# Patient Record
Sex: Female | Born: 1940 | Marital: Married | Smoking: Current every day smoker
Health system: Southern US, Community
[De-identification: ages and names within clinical notes are randomized; demographics above are authoritative.]

## PROBLEM LIST (undated history)

## (undated) DIAGNOSIS — I5021 Acute systolic (congestive) heart failure: Secondary | ICD-10-CM

## (undated) DIAGNOSIS — Z85118 Personal history of other malignant neoplasm of bronchus and lung: Secondary | ICD-10-CM

## (undated) DIAGNOSIS — I499 Cardiac arrhythmia, unspecified: Secondary | ICD-10-CM

## (undated) DIAGNOSIS — Z8619 Personal history of other infectious and parasitic diseases: Secondary | ICD-10-CM

## (undated) DIAGNOSIS — D469 Myelodysplastic syndrome, unspecified: Secondary | ICD-10-CM

## (undated) DIAGNOSIS — I739 Peripheral vascular disease, unspecified: Secondary | ICD-10-CM

## (undated) DIAGNOSIS — R630 Anorexia: Secondary | ICD-10-CM

## (undated) DIAGNOSIS — J439 Emphysema, unspecified: Secondary | ICD-10-CM

## (undated) DIAGNOSIS — J449 Chronic obstructive pulmonary disease, unspecified: Secondary | ICD-10-CM

## (undated) HISTORY — DX: Myelodysplastic syndrome, unspecified: D46.9

## (undated) HISTORY — DX: Personal history of other infectious and parasitic diseases: Z86.19

## (undated) HISTORY — DX: Cardiac arrhythmia, unspecified: I49.9

## (undated) HISTORY — PX: ANGIOPLASTY: SHX39

## (undated) HISTORY — PX: LUNG CANCER SURGERY: SHX702

## (undated) HISTORY — DX: Personal history of other malignant neoplasm of bronchus and lung: Z85.118

## (undated) HISTORY — DX: Acute systolic (congestive) heart failure: I50.21

## (undated) HISTORY — DX: Peripheral vascular disease, unspecified: I73.9

## (undated) HISTORY — DX: Emphysema, unspecified: J43.9

## (undated) HISTORY — DX: Anorexia: R63.0

## (undated) HISTORY — DX: Chronic obstructive pulmonary disease, unspecified: J44.9

## (undated) HISTORY — PX: OTHER SURGICAL HISTORY: SHX169

---

## 2003-11-06 HISTORY — PX: LOBECTOMY: SHX5089

## 2004-01-20 ENCOUNTER — Other Ambulatory Visit: Payer: Self-pay

## 2004-09-08 ENCOUNTER — Ambulatory Visit: Payer: Self-pay | Admitting: Oncology

## 2004-09-15 ENCOUNTER — Ambulatory Visit: Payer: Self-pay | Admitting: Oncology

## 2004-10-05 ENCOUNTER — Ambulatory Visit: Payer: Self-pay | Admitting: Oncology

## 2004-12-15 ENCOUNTER — Ambulatory Visit: Payer: Self-pay | Admitting: Oncology

## 2005-01-03 ENCOUNTER — Ambulatory Visit: Payer: Self-pay | Admitting: Oncology

## 2005-02-09 ENCOUNTER — Ambulatory Visit: Payer: Self-pay | Admitting: Oncology

## 2005-06-15 ENCOUNTER — Ambulatory Visit: Payer: Self-pay | Admitting: Oncology

## 2005-07-06 ENCOUNTER — Ambulatory Visit: Payer: Self-pay | Admitting: Oncology

## 2005-10-05 ENCOUNTER — Ambulatory Visit: Payer: Self-pay | Admitting: Oncology

## 2005-10-09 ENCOUNTER — Ambulatory Visit: Payer: Self-pay | Admitting: Oncology

## 2005-12-25 ENCOUNTER — Ambulatory Visit: Payer: Self-pay | Admitting: Oncology

## 2006-01-03 ENCOUNTER — Ambulatory Visit: Payer: Self-pay | Admitting: Oncology

## 2006-02-15 ENCOUNTER — Ambulatory Visit: Payer: Self-pay | Admitting: Oncology

## 2006-03-05 ENCOUNTER — Ambulatory Visit: Payer: Self-pay | Admitting: Oncology

## 2006-04-05 ENCOUNTER — Ambulatory Visit: Payer: Self-pay | Admitting: Oncology

## 2006-05-05 ENCOUNTER — Ambulatory Visit: Payer: Self-pay | Admitting: Oncology

## 2006-06-05 ENCOUNTER — Ambulatory Visit: Payer: Self-pay | Admitting: Oncology

## 2006-07-26 ENCOUNTER — Ambulatory Visit: Payer: Self-pay | Admitting: Oncology

## 2006-08-05 ENCOUNTER — Ambulatory Visit: Payer: Self-pay | Admitting: Oncology

## 2006-09-06 ENCOUNTER — Ambulatory Visit: Payer: Self-pay | Admitting: Oncology

## 2006-10-05 ENCOUNTER — Ambulatory Visit: Payer: Self-pay | Admitting: Oncology

## 2006-11-05 ENCOUNTER — Ambulatory Visit: Payer: Self-pay | Admitting: Oncology

## 2007-03-06 ENCOUNTER — Ambulatory Visit: Payer: Self-pay | Admitting: Oncology

## 2007-03-07 ENCOUNTER — Ambulatory Visit: Payer: Self-pay | Admitting: Oncology

## 2007-04-06 ENCOUNTER — Ambulatory Visit: Payer: Self-pay | Admitting: Oncology

## 2007-05-06 ENCOUNTER — Ambulatory Visit: Payer: Self-pay | Admitting: Oncology

## 2007-06-06 ENCOUNTER — Ambulatory Visit: Payer: Self-pay | Admitting: Oncology

## 2007-07-03 ENCOUNTER — Ambulatory Visit: Payer: Self-pay | Admitting: Oncology

## 2007-07-07 ENCOUNTER — Ambulatory Visit: Payer: Self-pay | Admitting: Oncology

## 2007-07-16 ENCOUNTER — Ambulatory Visit: Payer: Self-pay | Admitting: Vascular Surgery

## 2007-08-06 ENCOUNTER — Ambulatory Visit: Payer: Self-pay | Admitting: Oncology

## 2007-09-06 ENCOUNTER — Ambulatory Visit: Payer: Self-pay | Admitting: Oncology

## 2007-09-26 ENCOUNTER — Ambulatory Visit: Payer: Self-pay | Admitting: Oncology

## 2007-10-06 ENCOUNTER — Ambulatory Visit: Payer: Self-pay | Admitting: Oncology

## 2007-11-06 ENCOUNTER — Ambulatory Visit: Payer: Self-pay | Admitting: Oncology

## 2007-11-07 ENCOUNTER — Ambulatory Visit: Payer: Self-pay | Admitting: Oncology

## 2007-11-07 ENCOUNTER — Inpatient Hospital Stay: Payer: Self-pay | Admitting: Oncology

## 2007-11-13 ENCOUNTER — Other Ambulatory Visit: Payer: Self-pay

## 2007-11-15 ENCOUNTER — Inpatient Hospital Stay: Payer: Self-pay | Admitting: Oncology

## 2007-11-15 ENCOUNTER — Other Ambulatory Visit: Payer: Self-pay

## 2007-11-17 ENCOUNTER — Other Ambulatory Visit: Payer: Self-pay

## 2007-12-07 ENCOUNTER — Ambulatory Visit: Payer: Self-pay | Admitting: Oncology

## 2008-01-04 ENCOUNTER — Ambulatory Visit: Payer: Self-pay | Admitting: Oncology

## 2008-02-04 ENCOUNTER — Ambulatory Visit: Payer: Self-pay | Admitting: Oncology

## 2008-02-06 ENCOUNTER — Ambulatory Visit: Payer: Self-pay | Admitting: Oncology

## 2008-03-05 ENCOUNTER — Ambulatory Visit: Payer: Self-pay | Admitting: Oncology

## 2008-03-08 ENCOUNTER — Ambulatory Visit: Payer: Self-pay | Admitting: Vascular Surgery

## 2008-03-19 ENCOUNTER — Ambulatory Visit: Payer: Self-pay | Admitting: Vascular Surgery

## 2008-04-05 ENCOUNTER — Ambulatory Visit: Payer: Self-pay | Admitting: Oncology

## 2008-05-05 ENCOUNTER — Ambulatory Visit: Payer: Self-pay | Admitting: Oncology

## 2008-05-15 ENCOUNTER — Inpatient Hospital Stay: Payer: Self-pay | Admitting: Oncology

## 2008-06-05 ENCOUNTER — Ambulatory Visit: Payer: Self-pay | Admitting: Oncology

## 2008-06-30 ENCOUNTER — Inpatient Hospital Stay: Payer: Self-pay | Admitting: Oncology

## 2008-07-06 ENCOUNTER — Ambulatory Visit: Payer: Self-pay | Admitting: Oncology

## 2008-08-05 ENCOUNTER — Ambulatory Visit: Payer: Self-pay | Admitting: Oncology

## 2008-09-05 ENCOUNTER — Ambulatory Visit: Payer: Self-pay | Admitting: Oncology

## 2008-10-05 ENCOUNTER — Ambulatory Visit: Payer: Self-pay | Admitting: Oncology

## 2008-11-05 ENCOUNTER — Ambulatory Visit: Payer: Self-pay | Admitting: Oncology

## 2008-11-10 ENCOUNTER — Ambulatory Visit: Payer: Self-pay | Admitting: Pain Medicine

## 2008-11-17 ENCOUNTER — Ambulatory Visit: Payer: Self-pay | Admitting: Pain Medicine

## 2008-12-06 ENCOUNTER — Ambulatory Visit: Payer: Self-pay | Admitting: Oncology

## 2008-12-15 IMAGING — CT NM PET TUM IMG LTD AREA
1 of 2 series · 13 of 25 positions shown · non-contrast
Comparison: none

REASON FOR EXAM: Abnormal chest CT, lung CA
COMMENTS:

PROCEDURE:     PET - PET/CT DX LUNG CA  - November 19, 2007 [DATE]
RESULT:     The patient has a history of lung cancer, abnormal chest CT.
TECHNIQUE: Standard PET/CT is obtained. Blood sugar of 122 mg/dl is noted
prior to the procedure. 12 mCi of F-18/FDG was administered to the patient
and whole body PET/CT obtained.

[Series 3: ct wb 3.0 b30f · axial · 3.0mm · 0.98mm/px · z∈[-1275,-529]mm · 13 of 432 slices shown]
[im 31/432  soft-tissue]
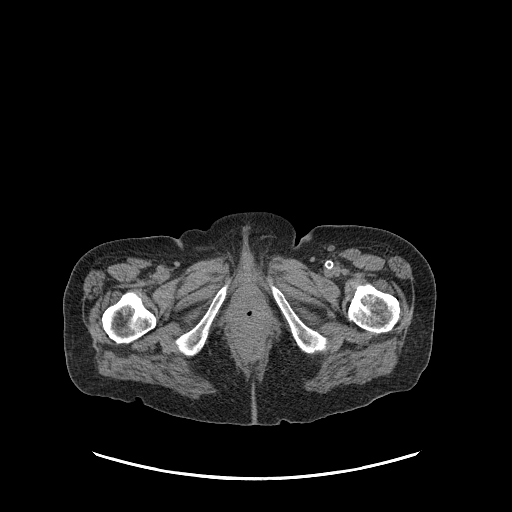
[im 62/432  soft-tissue]
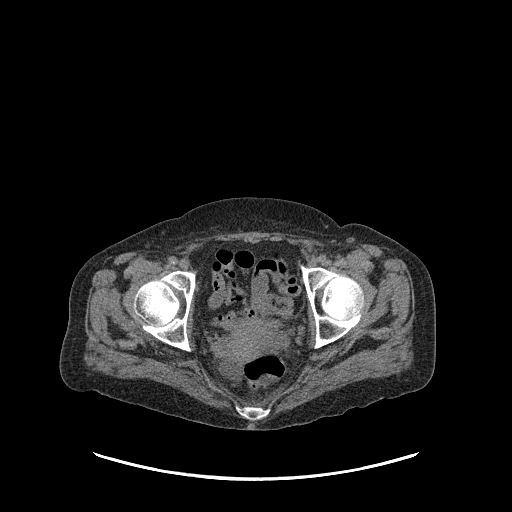
[im 93/432  soft-tissue]
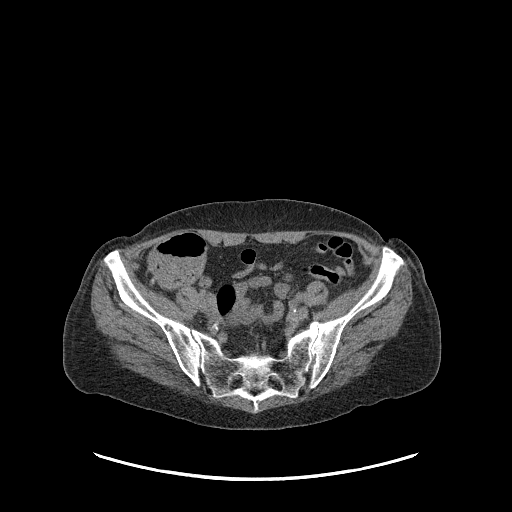
[im 124/432  soft-tissue]
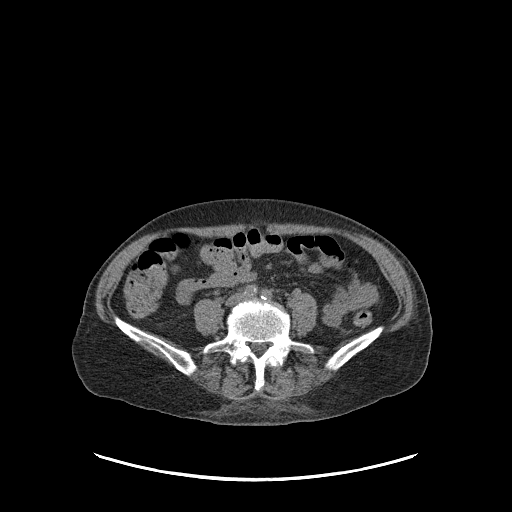
[im 154/432  soft-tissue]
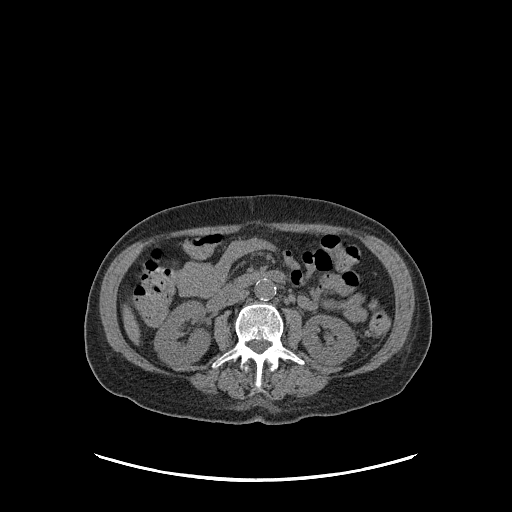
[im 185/432  soft-tissue]
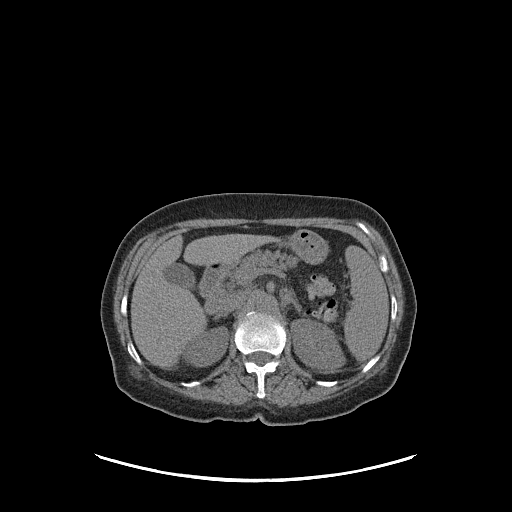
[im 216/432  soft-tissue]
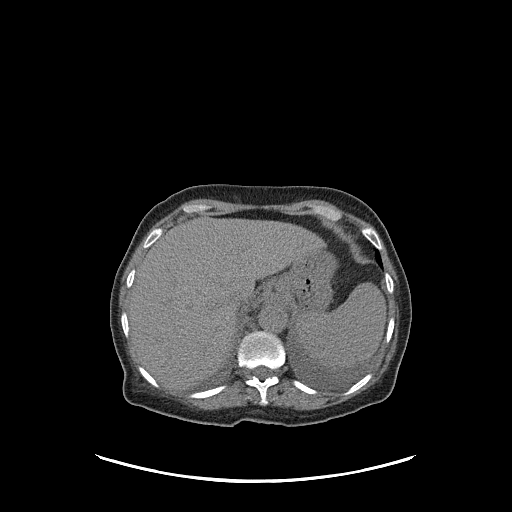
[im 247/432  soft-tissue]
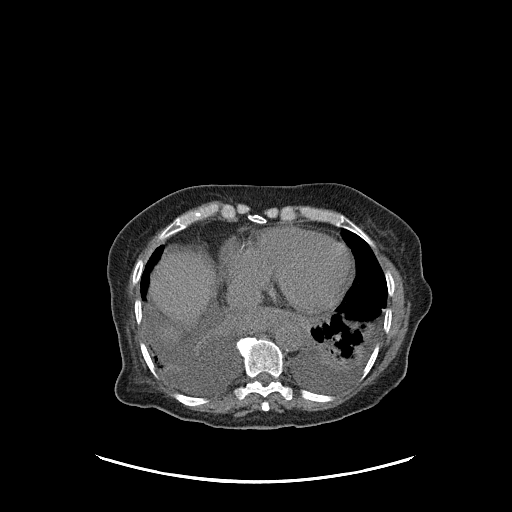
[im 278/432  soft-tissue]
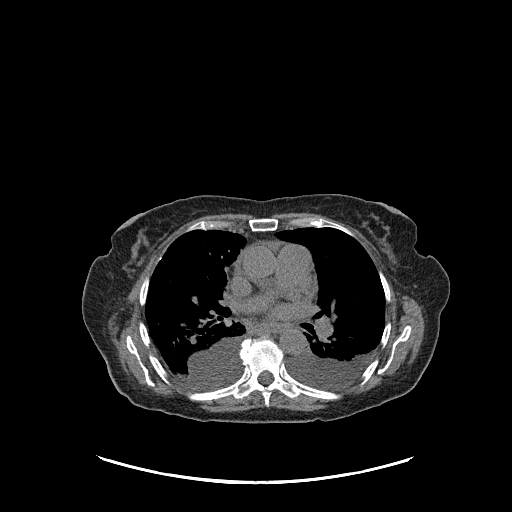
[im 308/432  soft-tissue]
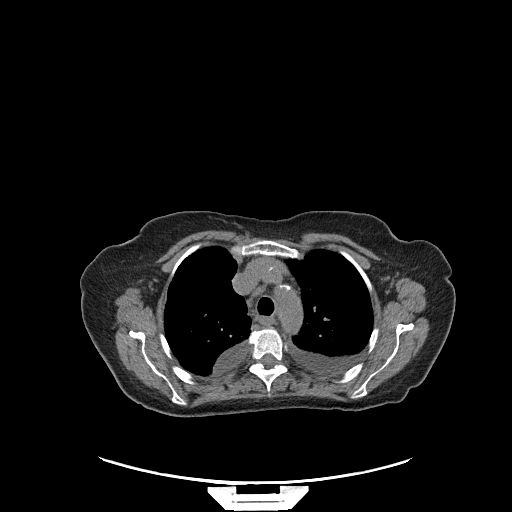
[im 339/432  soft-tissue]
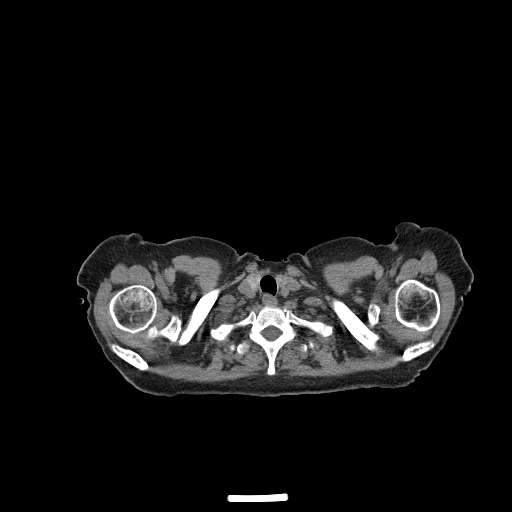
[im 370/432  soft-tissue]
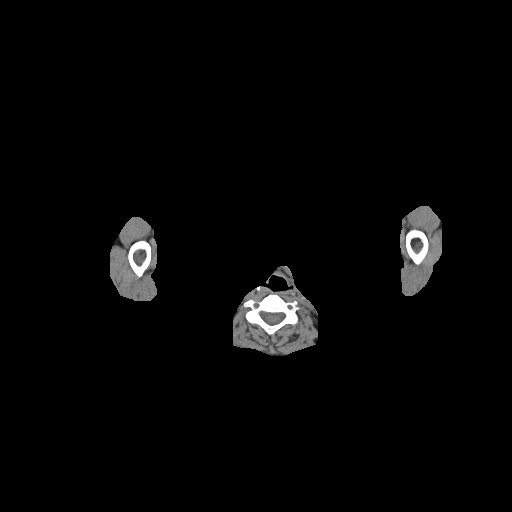
[im 401/432  soft-tissue]
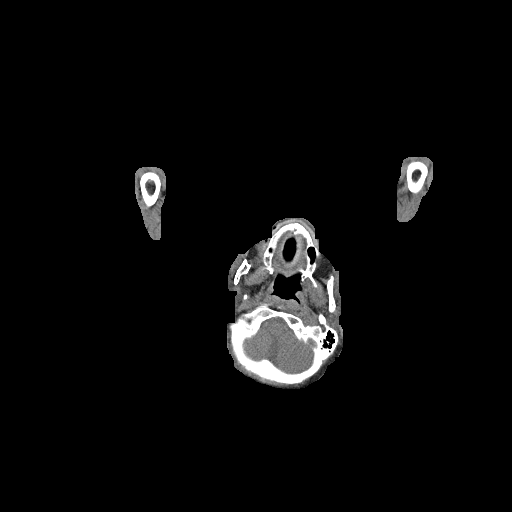

[13 of 25 positions shown; findings below may reference images not displayed]

Comparison is made to prior recent CT of chest, abdomen and pelvis of
11/09/2007.

Subcarinal PET positive adenopathy is noted. SUV values of 4 are noted in
the subcarinal nodes. Noted is a small, hiatal hernia. The stomach is very
FDG avid as is the lower esophagus. To exclude esophageal or gastric
malignancy, endoscopic evaluation is suggested. No other intra-abdominal or
pelvic abnormalities are identified. There is a borderline
retrocaval/pretracheal, tiny lymph node with SUV of 2.7.
IMPRESSION: 1.     PET positive subcarinal lymphadenopathy. Borderline PET positive
pretracheal/retrocaval node.
2.     Hiatal hernia.
3.     The distal esophagus, hiatal hernia and remainder of the
subdiaphragmatic stomach are intensely PET positive. Upper endoscopy is
suggested for further evaluation of the esophagus and stomach to exclude
malignancy.

## 2008-12-28 ENCOUNTER — Ambulatory Visit: Payer: Self-pay | Admitting: Pain Medicine

## 2008-12-29 ENCOUNTER — Ambulatory Visit: Payer: Self-pay | Admitting: Pain Medicine

## 2009-01-03 ENCOUNTER — Ambulatory Visit: Payer: Self-pay | Admitting: Oncology

## 2009-01-20 ENCOUNTER — Ambulatory Visit: Payer: Self-pay | Admitting: Pain Medicine

## 2009-01-26 ENCOUNTER — Ambulatory Visit: Payer: Self-pay | Admitting: Pain Medicine

## 2009-02-03 ENCOUNTER — Ambulatory Visit: Payer: Self-pay | Admitting: Oncology

## 2009-03-01 ENCOUNTER — Ambulatory Visit: Payer: Self-pay | Admitting: Pain Medicine

## 2009-03-05 ENCOUNTER — Ambulatory Visit: Payer: Self-pay | Admitting: Oncology

## 2009-03-07 ENCOUNTER — Ambulatory Visit: Payer: Self-pay | Admitting: Pain Medicine

## 2009-04-05 ENCOUNTER — Ambulatory Visit: Payer: Self-pay | Admitting: Oncology

## 2009-04-05 ENCOUNTER — Ambulatory Visit: Payer: Self-pay | Admitting: Pain Medicine

## 2009-05-05 ENCOUNTER — Ambulatory Visit: Payer: Self-pay | Admitting: Oncology

## 2009-06-05 ENCOUNTER — Ambulatory Visit: Payer: Self-pay | Admitting: Oncology

## 2009-07-06 ENCOUNTER — Ambulatory Visit: Payer: Self-pay | Admitting: Oncology

## 2009-09-02 ENCOUNTER — Ambulatory Visit: Payer: Self-pay | Admitting: Oncology

## 2009-09-05 ENCOUNTER — Ambulatory Visit: Payer: Self-pay | Admitting: Oncology

## 2009-09-09 ENCOUNTER — Ambulatory Visit: Payer: Self-pay | Admitting: Oncology

## 2009-10-05 ENCOUNTER — Ambulatory Visit: Payer: Self-pay | Admitting: Oncology

## 2009-11-05 ENCOUNTER — Ambulatory Visit: Payer: Self-pay | Admitting: Oncology

## 2010-01-03 ENCOUNTER — Ambulatory Visit: Payer: Self-pay | Admitting: Oncology

## 2010-01-20 ENCOUNTER — Ambulatory Visit: Payer: Self-pay | Admitting: Oncology

## 2010-02-03 ENCOUNTER — Ambulatory Visit: Payer: Self-pay | Admitting: Oncology

## 2010-04-05 ENCOUNTER — Ambulatory Visit: Payer: Self-pay | Admitting: Oncology

## 2010-04-28 ENCOUNTER — Ambulatory Visit: Payer: Self-pay | Admitting: Oncology

## 2010-05-05 ENCOUNTER — Ambulatory Visit: Payer: Self-pay | Admitting: Oncology

## 2010-07-06 ENCOUNTER — Ambulatory Visit: Payer: Self-pay | Admitting: Oncology

## 2010-07-12 ENCOUNTER — Ambulatory Visit: Payer: Self-pay | Admitting: Oncology

## 2010-08-05 ENCOUNTER — Ambulatory Visit: Payer: Self-pay | Admitting: Oncology

## 2010-09-05 ENCOUNTER — Ambulatory Visit: Payer: Self-pay | Admitting: Oncology

## 2010-11-10 ENCOUNTER — Ambulatory Visit: Payer: Self-pay | Admitting: Oncology

## 2010-12-06 ENCOUNTER — Ambulatory Visit: Payer: Self-pay | Admitting: Oncology

## 2011-03-22 ENCOUNTER — Ambulatory Visit: Payer: Self-pay | Admitting: Oncology

## 2011-04-06 ENCOUNTER — Ambulatory Visit: Payer: Self-pay | Admitting: Oncology

## 2011-07-20 ENCOUNTER — Ambulatory Visit: Payer: Self-pay | Admitting: Oncology

## 2011-08-06 ENCOUNTER — Ambulatory Visit: Payer: Self-pay | Admitting: Oncology

## 2011-10-31 ENCOUNTER — Inpatient Hospital Stay: Payer: Self-pay | Admitting: Oncology

## 2011-10-31 ENCOUNTER — Ambulatory Visit: Payer: Self-pay | Admitting: Oncology

## 2011-11-06 ENCOUNTER — Ambulatory Visit: Payer: Self-pay | Admitting: Oncology

## 2011-11-13 LAB — CBC CANCER CENTER
Basophil #: 0 x10 3/mm (ref 0.0–0.1)
Eosinophil #: 0.1 x10 3/mm (ref 0.0–0.7)
HCT: 41.6 % (ref 35.0–47.0)
Lymphocyte #: 0.1 x10 3/mm — ABNORMAL LOW (ref 1.0–3.6)
Lymphocyte %: 4.7 %
MCV: 81 fL (ref 80–100)
Monocyte %: 3.6 %
Platelet: 167 x10 3/mm (ref 150–440)
RDW: 16.7 % — ABNORMAL HIGH (ref 11.5–14.5)
WBC: 1.5 x10 3/mm — CL (ref 3.6–11.0)

## 2011-11-13 LAB — COMPREHENSIVE METABOLIC PANEL
Anion Gap: 8 (ref 7–16)
BUN: 10 mg/dL (ref 7–18)
Calcium, Total: 8.7 mg/dL (ref 8.5–10.1)
Chloride: 96 mmol/L — ABNORMAL LOW (ref 98–107)
Co2: 29 mmol/L (ref 21–32)
Creatinine: 0.67 mg/dL (ref 0.60–1.30)
EGFR (African American): >60
EGFR (Non-African Amer.): >60
Glucose: 95 mg/dL (ref 65–99)
SGOT(AST): 15 U/L (ref 15–37)
SGPT (ALT): 16 U/L
Total Protein: 6.3 g/dL — ABNORMAL LOW (ref 6.4–8.2)

## 2011-12-07 ENCOUNTER — Ambulatory Visit: Payer: Self-pay | Admitting: Oncology

## 2012-01-30 ENCOUNTER — Ambulatory Visit: Payer: Self-pay | Admitting: Oncology

## 2012-01-30 LAB — CBC CANCER CENTER
Basophil #: 0 x10 3/mm (ref 0.0–0.1)
Basophil %: 0 %
Eosinophil #: 0.1 x10 3/mm (ref 0.0–0.7)
Eosinophil %: 2.8 %
HCT: 36.4 % (ref 35.0–47.0)
HGB: 12.6 g/dL (ref 12.0–16.0)
Lymphocyte #: 0.1 x10 3/mm — ABNORMAL LOW (ref 1.0–3.6)
Lymphocyte %: 6.4 %
MCHC: 34.5 g/dL (ref 32.0–36.0)
MCV: 81 fL (ref 80–100)
Monocyte #: 0.2 x10 3/mm (ref 0.0–0.7)
Neutrophil #: 1.6 x10 3/mm (ref 1.4–6.5)
Neutrophil %: 80.3 %
RBC: 4.49 10*6/uL (ref 3.80–5.20)
RDW: 16 % — ABNORMAL HIGH (ref 11.5–14.5)
WBC: 2 x10 3/mm — CL (ref 3.6–11.0)

## 2012-02-04 ENCOUNTER — Ambulatory Visit: Payer: Self-pay | Admitting: Oncology

## 2012-02-14 LAB — COMPREHENSIVE METABOLIC PANEL
Anion Gap: 10 (ref 7–16)
BUN: 6 mg/dL — ABNORMAL LOW (ref 7–18)
Bilirubin,Total: 1.2 mg/dL — ABNORMAL HIGH (ref 0.2–1.0)
Co2: 25 mmol/L (ref 21–32)
EGFR (Non-African Amer.): >60
Glucose: 145 mg/dL — ABNORMAL HIGH (ref 65–99)
Potassium: 3.8 mmol/L (ref 3.5–5.1)
Total Protein: 6.8 g/dL (ref 6.4–8.2)

## 2012-02-14 LAB — CBC CANCER CENTER
Basophil #: 0 x10 3/mm (ref 0.0–0.1)
Eosinophil #: 0 x10 3/mm (ref 0.0–0.7)
Eosinophil %: 1.1 %
HCT: 36.8 % (ref 35.0–47.0)
HGB: 12.6 g/dL (ref 12.0–16.0)
MCH: 27.2 pg (ref 26.0–34.0)
MCV: 79 fL — ABNORMAL LOW (ref 80–100)
Monocyte #: 0.1 x10 3/mm — ABNORMAL LOW (ref 0.2–0.9)
Neutrophil %: 90.1 %
Platelet: 184 x10 3/mm (ref 150–440)
RDW: 16.1 % — ABNORMAL HIGH (ref 11.5–14.5)
WBC: 3 x10 3/mm — ABNORMAL LOW (ref 3.6–11.0)

## 2012-02-20 LAB — CULTURE, BLOOD (SINGLE)

## 2012-02-21 LAB — COMPREHENSIVE METABOLIC PANEL
Albumin: 3.1 g/dL — ABNORMAL LOW (ref 3.4–5.0)
Alkaline Phosphatase: 84 U/L (ref 50–136)
Anion Gap: 10 (ref 7–16)
BUN: 8 mg/dL (ref 7–18)
Creatinine: 0.76 mg/dL (ref 0.60–1.30)
Osmolality: 257 (ref 275–301)
SGOT(AST): 16 U/L (ref 15–37)
SGPT (ALT): 11 U/L — ABNORMAL LOW

## 2012-02-21 LAB — CBC WITH DIFFERENTIAL/PLATELET
Basophil %: 0.8 %
HCT: 34.7 % — ABNORMAL LOW (ref 35.0–47.0)
HGB: 11.5 g/dL — ABNORMAL LOW (ref 12.0–16.0)
MCH: 26 pg (ref 26.0–34.0)
MCV: 79 fL — ABNORMAL LOW (ref 80–100)
Monocyte #: 0.2 x10 3/mm (ref 0.2–0.9)
Monocyte %: 6.3 %
Neutrophil %: 88.1 %
Platelet: 186 10*3/uL (ref 150–440)
RDW: 15.6 % — ABNORMAL HIGH (ref 11.5–14.5)
WBC: 3 10*3/uL — ABNORMAL LOW (ref 3.6–11.0)

## 2012-03-05 ENCOUNTER — Ambulatory Visit: Payer: Self-pay | Admitting: Oncology

## 2012-03-06 ENCOUNTER — Ambulatory Visit: Payer: Self-pay | Admitting: Oncology

## 2012-04-05 ENCOUNTER — Ambulatory Visit: Payer: Self-pay | Admitting: Oncology

## 2012-04-14 LAB — CBC CANCER CENTER
Basophil #: 0 x10 3/mm (ref 0.0–0.1)
Basophil %: 1.5 %
Eosinophil #: 0.1 x10 3/mm (ref 0.0–0.7)
HCT: 39.1 % (ref 35.0–47.0)
HGB: 12.6 g/dL (ref 12.0–16.0)
MCHC: 32.3 g/dL (ref 32.0–36.0)
MCV: 79 fL — ABNORMAL LOW (ref 80–100)
Monocyte #: 0.1 x10 3/mm — ABNORMAL LOW (ref 0.2–0.9)
Neutrophil #: 1.4 x10 3/mm (ref 1.4–6.5)
Neutrophil %: 77.3 %
Platelet: 120 x10 3/mm — ABNORMAL LOW (ref 150–440)
RDW: 18.4 % — ABNORMAL HIGH (ref 11.5–14.5)

## 2012-04-14 LAB — COMPREHENSIVE METABOLIC PANEL
Albumin: 3.9 g/dL (ref 3.4–5.0)
Bilirubin,Total: 1.2 mg/dL — ABNORMAL HIGH (ref 0.2–1.0)
Calcium, Total: 9 mg/dL (ref 8.5–10.1)
Co2: 28 mmol/L (ref 21–32)
EGFR (African American): >60
EGFR (Non-African Amer.): >60
Glucose: 99 mg/dL (ref 65–99)
Osmolality: 263 (ref 275–301)
Potassium: 4.2 mmol/L (ref 3.5–5.1)
SGOT(AST): 19 U/L (ref 15–37)
Total Protein: 6.3 g/dL — ABNORMAL LOW (ref 6.4–8.2)

## 2012-05-05 ENCOUNTER — Ambulatory Visit: Payer: Self-pay | Admitting: Oncology

## 2012-06-09 ENCOUNTER — Ambulatory Visit: Payer: Self-pay | Admitting: Oncology

## 2012-06-09 LAB — CBC CANCER CENTER
Basophil %: 1 %
Eosinophil #: 0.1 x10 3/mm (ref 0.0–0.7)
HGB: 13.6 g/dL (ref 12.0–16.0)
Lymphocyte %: 6.5 %
MCH: 26.7 pg (ref 26.0–34.0)
MCHC: 33.7 g/dL (ref 32.0–36.0)
Monocyte #: 0.1 x10 3/mm — ABNORMAL LOW (ref 0.2–0.9)
Neutrophil %: 80.6 %
Platelet: 111 x10 3/mm — ABNORMAL LOW (ref 150–440)
RBC: 5.08 10*6/uL (ref 3.80–5.20)

## 2012-06-09 LAB — BASIC METABOLIC PANEL
Anion Gap: 7 (ref 7–16)
Calcium, Total: 9.1 mg/dL (ref 8.5–10.1)
Co2: 30 mmol/L (ref 21–32)
EGFR (African American): >60
EGFR (Non-African Amer.): >60
Glucose: 106 mg/dL — ABNORMAL HIGH (ref 65–99)
Osmolality: 268 (ref 275–301)
Sodium: 134 mmol/L — ABNORMAL LOW (ref 136–145)

## 2012-07-06 ENCOUNTER — Ambulatory Visit: Payer: Self-pay | Admitting: Oncology

## 2012-07-14 LAB — COMPREHENSIVE METABOLIC PANEL
Anion Gap: 6 — ABNORMAL LOW (ref 7–16)
Bilirubin,Total: 1.6 mg/dL — ABNORMAL HIGH (ref 0.2–1.0)
Chloride: 94 mmol/L — ABNORMAL LOW (ref 98–107)
Co2: 30 mmol/L (ref 21–32)
Creatinine: 0.83 mg/dL (ref 0.60–1.30)
EGFR (African American): >60
EGFR (Non-African Amer.): >60
Potassium: 4.2 mmol/L (ref 3.5–5.1)
SGOT(AST): 17 U/L (ref 15–37)
SGPT (ALT): 24 U/L (ref 12–78)

## 2012-07-14 LAB — CBC CANCER CENTER
Basophil #: 0 x10 3/mm (ref 0.0–0.1)
Eosinophil #: 0.1 x10 3/mm (ref 0.0–0.7)
Lymphocyte %: 2.1 %
MCHC: 32.7 g/dL (ref 32.0–36.0)
Neutrophil #: 3.9 x10 3/mm (ref 1.4–6.5)
Neutrophil %: 88.1 %
RDW: 17.4 % — ABNORMAL HIGH (ref 11.5–14.5)

## 2012-07-22 ENCOUNTER — Other Ambulatory Visit: Payer: Self-pay | Admitting: Internal Medicine

## 2012-08-05 ENCOUNTER — Ambulatory Visit: Payer: Self-pay | Admitting: Oncology

## 2012-08-06 ENCOUNTER — Ambulatory Visit: Payer: Self-pay | Admitting: Internal Medicine

## 2012-08-07 LAB — CBC CANCER CENTER
Basophil %: 0.5 %
Eosinophil %: 1.4 %
HCT: 39.2 % (ref 35.0–47.0)
Lymphocyte #: 0.1 x10 3/mm — ABNORMAL LOW (ref 1.0–3.6)
MCV: 82 fL (ref 80–100)
Monocyte %: 5.4 %
Neutrophil #: 5.2 x10 3/mm (ref 1.4–6.5)
RBC: 4.78 10*6/uL (ref 3.80–5.20)
RDW: 18.3 % — ABNORMAL HIGH (ref 11.5–14.5)
WBC: 5.7 x10 3/mm (ref 3.6–11.0)

## 2012-08-07 LAB — COMPREHENSIVE METABOLIC PANEL
Alkaline Phosphatase: 71 U/L (ref 50–136)
Anion Gap: 10 (ref 7–16)
Bilirubin,Total: 1.2 mg/dL — ABNORMAL HIGH (ref 0.2–1.0)
Calcium, Total: 8.5 mg/dL (ref 8.5–10.1)
Co2: 28 mmol/L (ref 21–32)
Creatinine: 0.81 mg/dL (ref 0.60–1.30)
Osmolality: 272 (ref 275–301)
SGPT (ALT): 21 U/L (ref 12–78)

## 2012-08-12 LAB — CULTURE, BLOOD (SINGLE)

## 2012-08-27 LAB — CBC CANCER CENTER
Basophil %: 0.2 %
Eosinophil #: 0.1 x10 3/mm (ref 0.0–0.7)
Eosinophil %: 1 %
HCT: 39.6 % (ref 35.0–47.0)
HGB: 13 g/dL (ref 12.0–16.0)
Lymphocyte %: 4.7 %
MCH: 26.5 pg (ref 26.0–34.0)
MCHC: 32.7 g/dL (ref 32.0–36.0)
MCV: 81 fL (ref 80–100)
Monocyte %: 4.4 %
Neutrophil #: 5.5 x10 3/mm (ref 1.4–6.5)
Neutrophil %: 89.7 %
RDW: 17.7 % — ABNORMAL HIGH (ref 11.5–14.5)
WBC: 6.2 x10 3/mm (ref 3.6–11.0)

## 2012-08-27 LAB — COMPREHENSIVE METABOLIC PANEL
Alkaline Phosphatase: 70 U/L (ref 50–136)
Anion Gap: 9 (ref 7–16)
Calcium, Total: 9 mg/dL (ref 8.5–10.1)
Chloride: 95 mmol/L — ABNORMAL LOW (ref 98–107)
Co2: 29 mmol/L (ref 21–32)
Creatinine: 0.62 mg/dL (ref 0.60–1.30)
EGFR (African American): >60
EGFR (Non-African Amer.): >60
SGPT (ALT): 18 U/L (ref 12–78)
Sodium: 133 mmol/L — ABNORMAL LOW (ref 136–145)

## 2012-09-01 LAB — COMPREHENSIVE METABOLIC PANEL
Albumin: 2.8 g/dL — ABNORMAL LOW (ref 3.4–5.0)
Alkaline Phosphatase: 57 U/L (ref 50–136)
Anion Gap: 6 — ABNORMAL LOW (ref 7–16)
BUN: 16 mg/dL (ref 7–18)
Calcium, Total: 8.4 mg/dL — ABNORMAL LOW (ref 8.5–10.1)
Chloride: 93 mmol/L — ABNORMAL LOW (ref 98–107)
Co2: 30 mmol/L (ref 21–32)
Glucose: 107 mg/dL — ABNORMAL HIGH (ref 65–99)
Osmolality: 261 (ref 275–301)
Potassium: 4.2 mmol/L (ref 3.5–5.1)
SGOT(AST): 13 U/L — ABNORMAL LOW (ref 15–37)
SGPT (ALT): 21 U/L (ref 12–78)

## 2012-09-01 LAB — CBC CANCER CENTER
Basophil %: 0.2 %
Eosinophil #: 0.1 x10 3/mm (ref 0.0–0.7)
Eosinophil %: 1.1 %
HGB: 12.4 g/dL (ref 12.0–16.0)
Lymphocyte %: 2.9 %
Monocyte %: 3.6 %
Neutrophil #: 6.9 x10 3/mm — ABNORMAL HIGH (ref 1.4–6.5)
Neutrophil %: 92.2 %
Platelet: 155 x10 3/mm (ref 150–440)
RBC: 4.61 10*6/uL (ref 3.80–5.20)

## 2012-09-03 ENCOUNTER — Inpatient Hospital Stay: Payer: Self-pay | Admitting: Oncology

## 2012-09-03 LAB — COMPREHENSIVE METABOLIC PANEL
Albumin: 2.7 g/dL — ABNORMAL LOW (ref 3.4–5.0)
Alkaline Phosphatase: 62 U/L (ref 50–136)
Anion Gap: 11 (ref 7–16)
BUN: 17 mg/dL (ref 7–18)
Calcium, Total: 8.3 mg/dL — ABNORMAL LOW (ref 8.5–10.1)
Co2: 26 mmol/L (ref 21–32)
Creatinine: 0.72 mg/dL (ref 0.60–1.30)
EGFR (Non-African Amer.): >60
Glucose: 99 mg/dL (ref 65–99)
Osmolality: 261 (ref 275–301)
SGPT (ALT): 26 U/L (ref 12–78)
Sodium: 129 mmol/L — ABNORMAL LOW (ref 136–145)
Total Protein: 5.5 g/dL — ABNORMAL LOW (ref 6.4–8.2)

## 2012-09-03 LAB — CBC CANCER CENTER
Basophil %: 0.1 %
Eosinophil #: 0.1 x10 3/mm (ref 0.0–0.7)
HGB: 11.3 g/dL — ABNORMAL LOW (ref 12.0–16.0)
MCH: 26.8 pg (ref 26.0–34.0)
MCHC: 32.6 g/dL (ref 32.0–36.0)
Monocyte #: 0.4 x10 3/mm (ref 0.2–0.9)
Neutrophil #: 5 x10 3/mm (ref 1.4–6.5)
Neutrophil %: 88.2 %
Platelet: 159 x10 3/mm (ref 150–440)
RDW: 17.8 % — ABNORMAL HIGH (ref 11.5–14.5)
WBC: 5.6 x10 3/mm (ref 3.6–11.0)

## 2012-09-03 LAB — MAGNESIUM: Magnesium: 1.5 mg/dL — ABNORMAL LOW

## 2012-09-05 ENCOUNTER — Ambulatory Visit: Payer: Self-pay | Admitting: Oncology

## 2012-09-07 LAB — EXPECTORATED SPUTUM ASSESSMENT W GRAM STAIN, RFLX TO RESP C

## 2012-09-09 LAB — CULTURE, BLOOD (SINGLE)

## 2012-09-10 LAB — COMPREHENSIVE METABOLIC PANEL
Albumin: 2.5 g/dL — ABNORMAL LOW (ref 3.4–5.0)
Alkaline Phosphatase: 59 U/L (ref 50–136)
Anion Gap: 5 — ABNORMAL LOW (ref 7–16)
BUN: 13 mg/dL (ref 7–18)
Bilirubin,Total: 0.7 mg/dL (ref 0.2–1.0)
Chloride: 87 mmol/L — ABNORMAL LOW (ref 98–107)
Creatinine: 0.55 mg/dL — ABNORMAL LOW (ref 0.60–1.30)
Osmolality: 254 (ref 275–301)
Potassium: 3.9 mmol/L (ref 3.5–5.1)
SGPT (ALT): 24 U/L (ref 12–78)
Sodium: 126 mmol/L — ABNORMAL LOW (ref 136–145)
Total Protein: 5.3 g/dL — ABNORMAL LOW (ref 6.4–8.2)

## 2012-09-10 LAB — CBC CANCER CENTER
Basophil %: 0.6 %
Eosinophil #: 0 x10 3/mm (ref 0.0–0.7)
Eosinophil %: 0.4 %
HGB: 11.2 g/dL — ABNORMAL LOW (ref 12.0–16.0)
Lymphocyte #: 0.1 x10 3/mm — ABNORMAL LOW (ref 1.0–3.6)
Lymphocyte %: 1.9 %
Monocyte #: 0.3 x10 3/mm (ref 0.2–0.9)
Neutrophil #: 4.7 x10 3/mm (ref 1.4–6.5)
Neutrophil %: 91.5 %
Platelet: 197 x10 3/mm (ref 150–440)
RBC: 4.11 10*6/uL (ref 3.80–5.20)
WBC: 5.1 x10 3/mm (ref 3.6–11.0)

## 2012-09-17 LAB — CBC CANCER CENTER
Basophil %: 0.5 %
Eosinophil #: 0 x10 3/mm (ref 0.0–0.7)
Eosinophil %: 0.2 %
HCT: 35.6 % (ref 35.0–47.0)
HGB: 11.7 g/dL — ABNORMAL LOW (ref 12.0–16.0)
Lymphocyte %: 3.7 %
MCH: 27.6 pg (ref 26.0–34.0)
Monocyte #: 0.5 x10 3/mm (ref 0.2–0.9)
Neutrophil #: 7.1 x10 3/mm — ABNORMAL HIGH (ref 1.4–6.5)
Neutrophil %: 89.2 %
RBC: 4.25 10*6/uL (ref 3.80–5.20)
WBC: 8 x10 3/mm (ref 3.6–11.0)

## 2012-09-20 ENCOUNTER — Inpatient Hospital Stay: Payer: Self-pay | Admitting: Internal Medicine

## 2012-09-20 DIAGNOSIS — I4891 Unspecified atrial fibrillation: Secondary | ICD-10-CM

## 2012-09-20 LAB — COMPREHENSIVE METABOLIC PANEL
BUN: 11 mg/dL (ref 7–18)
Bilirubin,Total: 1.6 mg/dL — ABNORMAL HIGH (ref 0.2–1.0)
Chloride: 82 mmol/L — ABNORMAL LOW (ref 98–107)
Creatinine: 0.63 mg/dL (ref 0.60–1.30)
EGFR (African American): >60
Potassium: 4.1 mmol/L (ref 3.5–5.1)
SGPT (ALT): 22 U/L (ref 12–78)
Sodium: 123 mmol/L — ABNORMAL LOW (ref 136–145)
Total Protein: 5.9 g/dL — ABNORMAL LOW (ref 6.4–8.2)

## 2012-09-20 LAB — TSH: Thyroid Stimulating Horm: 1.61 u[IU]/mL

## 2012-09-20 LAB — TROPONIN I: Troponin-I: 0.02 ng/mL

## 2012-09-20 LAB — CBC
HGB: 12.2 g/dL (ref 12.0–16.0)
MCH: 27.8 pg (ref 26.0–34.0)
MCV: 83 fL (ref 80–100)
Platelet: 168 10*3/uL (ref 150–440)
RBC: 4.4 10*6/uL (ref 3.80–5.20)
RDW: 19.7 % — ABNORMAL HIGH (ref 11.5–14.5)
WBC: 8.7 10*3/uL (ref 3.6–11.0)

## 2012-09-20 LAB — CK TOTAL AND CKMB (NOT AT ARMC): CK, Total: 26 U/L (ref 21–215)

## 2012-09-20 LAB — PROTIME-INR: Prothrombin Time: 13.6 secs (ref 11.5–14.7)

## 2012-09-21 DIAGNOSIS — I517 Cardiomegaly: Secondary | ICD-10-CM

## 2012-09-21 LAB — BASIC METABOLIC PANEL
Anion Gap: 2 — ABNORMAL LOW (ref 7–16)
Calcium, Total: 8.2 mg/dL — ABNORMAL LOW (ref 8.5–10.1)
Chloride: 94 mmol/L — ABNORMAL LOW (ref 98–107)
Creatinine: 0.51 mg/dL — ABNORMAL LOW (ref 0.60–1.30)
EGFR (African American): >60
Glucose: 186 mg/dL — ABNORMAL HIGH (ref 65–99)
Osmolality: 267 (ref 275–301)
Potassium: 3.8 mmol/L (ref 3.5–5.1)
Sodium: 131 mmol/L — ABNORMAL LOW (ref 136–145)

## 2012-09-21 LAB — CBC WITH DIFFERENTIAL/PLATELET
Basophil #: 0 10*3/uL (ref 0.0–0.1)
Eosinophil #: 0 10*3/uL (ref 0.0–0.7)
HCT: 29.7 % — ABNORMAL LOW (ref 35.0–47.0)
Lymphocyte #: 0.1 10*3/uL — ABNORMAL LOW (ref 1.0–3.6)
Lymphocyte %: 4.7 %
MCHC: 32.9 g/dL (ref 32.0–36.0)
Neutrophil #: 2.7 10*3/uL (ref 1.4–6.5)
Platelet: 125 10*3/uL — ABNORMAL LOW (ref 150–440)
RDW: 19.2 % — ABNORMAL HIGH (ref 11.5–14.5)

## 2012-09-22 DIAGNOSIS — I4891 Unspecified atrial fibrillation: Secondary | ICD-10-CM

## 2012-09-23 LAB — CBC WITH DIFFERENTIAL/PLATELET
Basophil #: 0.1 10*3/uL (ref 0.0–0.1)
Eosinophil #: 0 10*3/uL (ref 0.0–0.7)
HGB: 13.1 g/dL (ref 12.0–16.0)
Lymphocyte #: 0.2 10*3/uL — ABNORMAL LOW (ref 1.0–3.6)
MCH: 28.6 pg (ref 26.0–34.0)
MCHC: 33.8 g/dL (ref 32.0–36.0)
MCV: 85 fL (ref 80–100)
Monocyte #: 0.4 x10 3/mm (ref 0.2–0.9)
Neutrophil %: 92.4 %
Platelet: 176 10*3/uL (ref 150–440)
RDW: 20.4 % — ABNORMAL HIGH (ref 11.5–14.5)
WBC: 9 10*3/uL (ref 3.6–11.0)

## 2012-09-23 LAB — BASIC METABOLIC PANEL
Anion Gap: 6 — ABNORMAL LOW (ref 7–16)
Chloride: 84 mmol/L — ABNORMAL LOW (ref 98–107)
Co2: 40 mmol/L (ref 21–32)
EGFR (Non-African Amer.): >60
Osmolality: 266 (ref 275–301)

## 2012-09-24 LAB — BASIC METABOLIC PANEL
BUN: 15 mg/dL (ref 7–18)
Calcium, Total: 9.2 mg/dL (ref 8.5–10.1)
Chloride: 85 mmol/L — ABNORMAL LOW (ref 98–107)
EGFR (African American): >60
EGFR (Non-African Amer.): >60
Glucose: 194 mg/dL — ABNORMAL HIGH (ref 65–99)
Osmolality: 267 (ref 275–301)
Sodium: 130 mmol/L — ABNORMAL LOW (ref 136–145)

## 2012-09-25 LAB — EXPECTORATED SPUTUM ASSESSMENT W GRAM STAIN, RFLX TO RESP C

## 2012-09-26 LAB — CULTURE, BLOOD (SINGLE)

## 2012-10-05 ENCOUNTER — Ambulatory Visit: Payer: Self-pay | Admitting: Oncology

## 2012-10-06 ENCOUNTER — Ambulatory Visit: Payer: Self-pay | Admitting: Cardiovascular Disease

## 2012-10-07 ENCOUNTER — Encounter: Payer: Self-pay | Admitting: Cardiovascular Disease

## 2012-10-07 ENCOUNTER — Ambulatory Visit (INDEPENDENT_AMBULATORY_CARE_PROVIDER_SITE_OTHER): Payer: Medicare Other | Admitting: Cardiovascular Disease

## 2012-10-07 VITALS — BP 130/52 | HR 68 | Ht 67.5 in | Wt 125.0 lb

## 2012-10-07 DIAGNOSIS — J449 Chronic obstructive pulmonary disease, unspecified: Secondary | ICD-10-CM

## 2012-10-07 DIAGNOSIS — C3490 Malignant neoplasm of unspecified part of unspecified bronchus or lung: Secondary | ICD-10-CM | POA: Insufficient documentation

## 2012-10-07 DIAGNOSIS — R000 Tachycardia, unspecified: Secondary | ICD-10-CM

## 2012-10-07 DIAGNOSIS — I4891 Unspecified atrial fibrillation: Secondary | ICD-10-CM

## 2012-10-07 MED ORDER — FUROSEMIDE 20 MG PO TABS: 20.0000 mg | ORAL_TABLET | Freq: Every day | ORAL | Status: AC

## 2012-10-07 MED ORDER — POTASSIUM CHLORIDE CRYS ER 10 MEQ PO TBCR: 10.0000 meq | EXTENDED_RELEASE_TABLET | Freq: Every day | ORAL | Status: AC

## 2012-10-07 MED ORDER — METOPROLOL TARTRATE 25 MG PO TABS: 25.0000 mg | ORAL_TABLET | Freq: Two times a day (BID) | ORAL | Status: AC

## 2012-10-07 MED ORDER — AMIODARONE HCL 200 MG PO TABS: 200.0000 mg | ORAL_TABLET | Freq: Two times a day (BID) | ORAL | Status: AC

## 2012-10-07 NOTE — Patient Instructions (Addendum)
You are doing well. Please decrease the amiodarone to 200 mg twice a day (cut the 400 mg pill in 1/2, take twice a day)  Please call us if you have new issues that need to be addressed before your next appt.  Your physician wants you to follow-up in: 1 months.

## 2012-10-07 NOTE — Assessment & Plan Note (Signed)
Followed by Dr. Cliffton Asters, [facility] oncology.  Finishing her radiation this week

## 2012-10-07 NOTE — Assessment & Plan Note (Addendum)
Severe underlying COPD. She continues to smoke. Currently on prednisone and inhalers. Chronic cough on today's visit. She reports mild low grade fever. He has suggestive symptoms get worse, that she call Dr. Cliffton Asters for antibiotics.

## 2012-10-07 NOTE — Progress Notes (Signed)
Patient ID: Cheryl Duncan, female    DOB: Oct 23, 1941, 71 y.o.   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 She presents to establish care in our office. She was seen in consultation by our group.  She was started on amiodarone for rate and rhythm control with cardioversion to normal sinus rhythm. She was changed to oral amiodarone 400 mg twice a day and presents today. She reports that her shortness of breath has significantly improved. Rare episodes of tachycardia that are short lived, for the most part her heart rate has been 60-70. She has a chronic cough, nonproductive. She denies any significant change in her weight. Continued smoking. Appetite has been better on Megace and prednisone  Echocardiogram in the hospital showed ejection fraction 45-50%, mild to moderately reduced RV function, right atrium moderately dilated, mild to moderate TR, right ventricular systolic pressure 50-60 mm mercury consistent with moderate pulmonary hypertension  EKG in the hospital showed atrial flutter with ventricular rate 137 beats per minute EKG today shows normal sinus rhythm with rate 68 beats per minute, no significant ST or T wave changes   Outpatient Encounter Prescriptions as of 10/07/2012  Medication Sig Dispense Refill  . ALPRAZolam (XANAX) 0.25 MG tablet Take 0.25 mg by mouth at bedtime as needed.      Marland Kitchen amiodarone (PACERONE) 200 MG tablet Take 1 tablet (200 mg total) by mouth 2 (two) times daily.  60 tablet  6  . benzonatate  (TESSALON) 100 MG capsule Take 100 mg by mouth 3 (three) times daily as needed.      . fentaNYL (DURAGESIC - DOSED MCG/HR) 12 MCG/HR Place 1 patch onto the skin every 3 (three) days.      . furosemide (LASIX) 20 MG tablet Take 1 tablet (20 mg total) by mouth daily.  90 tablet  3  . gabapentin (NEURONTIN) 300 MG capsule Take 600 mg by mouth 3 (three) times daily.      Marland Kitchen guaifenesin (ROBITUSSIN) 100 MG/5ML syrup Take 200 mg by mouth 4 (four) times daily as needed.      Marland Kitchen ipratropium-albuterol (DUONEB) 0.5-2.5 (3) MG/3ML SOLN Take 3 mLs by nebulization every 6 (six) hours as needed.      . megestrol (MEGACE ES) 625 MG/5ML suspension Take 625 mg by mouth daily.      . metoprolol tartrate (LOPRESSOR) 25 MG tablet Take 1 tablet (25 mg total) by mouth 2 (two) times daily.  180 tablet  3  . NON FORMULARY Oxygen 2 liters      . potassium chloride (K-DUR,KLOR-CON) 10 MEQ tablet Take 1 tablet (10 mEq total) by mouth daily.  90 tablet  3  . predniSONE (DELTASONE) 20 MG tablet Take 20 mg by mouth daily.      . ranitidine (ZANTAC) 75 MG tablet Take 75 mg by mouth as needed.         Review of Systems  Constitutional: Negative.   HENT: Negative.   Eyes: Negative.   Respiratory: Positive for cough and shortness of breath.   Cardiovascular: Negative.   Gastrointestinal: Negative.   Musculoskeletal:  Positive for gait problem.  Skin: Negative.   Neurological: Negative.   Hematological: Negative.   Psychiatric/Behavioral: Negative.   All other systems reviewed and are negative.    BP 130/52  Pulse 68  Ht 5' 7.5" (1.715 m)  Wt 125 lb (56.7 kg)  BMI 19.29 kg/m2  Physical Exam  Nursing note and vitals reviewed. Constitutional: She is oriented to person, place, and time. She appears well-developed and well-nourished.  HENT:  Head: Normocephalic.  Nose: Nose normal.  Mouth/Throat: Oropharynx is clear and moist.  Eyes: Conjunctivae normal are normal. Pupils are equal, round, and reactive to light.   Neck: Normal range of motion. Neck supple. No JVD present.  Cardiovascular: Normal rate, regular rhythm, S1 normal, S2 normal, normal heart sounds and intact distal pulses.  Exam reveals no gallop and no friction rub.   No murmur heard. Pulmonary/Chest: Effort normal. No respiratory distress. She has decreased breath sounds. She has wheezes. She has rales. She exhibits no tenderness.  Abdominal: Soft. Bowel sounds are normal. She exhibits no distension. There is no tenderness.  Musculoskeletal: Normal range of motion. She exhibits no edema and no tenderness.  Lymphadenopathy:    She has no cervical adenopathy.  Neurological: She is alert and oriented to person, place, and time. Coordination normal.  Skin: Skin is warm and dry. No rash noted. No erythema.  Psychiatric: She has a normal mood and affect. Her behavior is normal. Judgment and thought content normal.         Assessment and Plan

## 2012-10-07 NOTE — Assessment & Plan Note (Addendum)
In the hospital, atrial fibrillation and atrial flutter. Conversion to normal sinus rhythm on amiodarone. Unable to tolerate other medications as blood pressure was low in the setting of pneumonia. We'll decrease amiodarone to 200 mg twice a day. Followup in several weeks' time at which time we hope to decrease her amiodarone to 200 mg daily. Continue her metoprolol  25 mg twice a day.

## 2012-11-04 ENCOUNTER — Inpatient Hospital Stay: Payer: Self-pay | Admitting: Internal Medicine

## 2012-11-04 ENCOUNTER — Telehealth: Payer: Self-pay

## 2012-11-04 LAB — CBC
HGB: 11.2 g/dL — ABNORMAL LOW (ref 12.0–16.0)
MCH: 29.1 pg (ref 26.0–34.0)
MCHC: 33.5 g/dL (ref 32.0–36.0)
MCV: 87 fL (ref 80–100)
Platelet: 176 10*3/uL (ref 150–440)
RBC: 3.86 10*6/uL (ref 3.80–5.20)
RDW: 20.6 % — ABNORMAL HIGH (ref 11.5–14.5)

## 2012-11-04 LAB — BASIC METABOLIC PANEL
BUN: 9 mg/dL (ref 7–18)
Calcium, Total: 8.9 mg/dL (ref 8.5–10.1)
Creatinine: 0.7 mg/dL (ref 0.60–1.30)
EGFR (African American): >60
EGFR (Non-African Amer.): >60
Glucose: 90 mg/dL (ref 65–99)
Osmolality: 266 (ref 275–301)
Potassium: 3.8 mmol/L (ref 3.5–5.1)
Sodium: 134 mmol/L — ABNORMAL LOW (ref 136–145)

## 2012-11-04 LAB — CK-MB: CK-MB: 1.4 ng/mL (ref 0.5–3.6)

## 2012-11-04 LAB — TROPONIN I: Troponin-I: <0.02 ng/mL

## 2012-11-04 LAB — CK TOTAL AND CKMB (NOT AT ARMC)
CK, Total: 30 U/L (ref 21–215)
CK-MB: 1.7 ng/mL (ref 0.5–3.6)

## 2012-11-04 NOTE — Telephone Encounter (Signed)
Pt's husband called to say pt has been having worsening chest pressure over the last 2 days.  Says it is worse this am and pt was unable to speak with me herself d/t "worsening shortness of breath".  She has no know hx CAD. No hx stents or cath. Has hx copd, lung ca and atrial fib Continues to smoke Wears home O2 Because pt is having active chest pressure and worsening sob, I advised husband to have pt go to ER He states, "I don't want her to go sit in the ER" I had him hold while I paged Dr. Kirke Corin Dr. Kirke Corin is also suggesting pt go to ER if she is having active symptoms I explained this to husband who says he will take her to ER ER nursing staff was made aware

## 2012-11-05 ENCOUNTER — Ambulatory Visit: Payer: Self-pay | Admitting: Oncology

## 2012-11-07 LAB — CBC CANCER CENTER
Basophil #: 0 x10 3/mm (ref 0.0–0.1)
Basophil %: 0.4 %
Eosinophil #: 0.1 x10 3/mm (ref 0.0–0.7)
Eosinophil %: 1 %
HCT: 36.6 % (ref 35.0–47.0)
HGB: 12.1 g/dL (ref 12.0–16.0)
Lymphocyte #: 0.5 x10 3/mm — ABNORMAL LOW (ref 1.0–3.6)
Lymphocyte %: 6.6 %
MCH: 29 pg (ref 26.0–34.0)
MCHC: 33.1 g/dL (ref 32.0–36.0)
MCV: 88 fL (ref 80–100)
Monocyte #: 0.7 x10 3/mm (ref 0.2–0.9)
Monocyte %: 9.3 %
Neutrophil #: 6 x10 3/mm (ref 1.4–6.5)
Neutrophil %: 82.7 %
Platelet: 230 x10 3/mm (ref 150–440)
RBC: 4.18 10*6/uL (ref 3.80–5.20)
RDW: 21.3 % — ABNORMAL HIGH (ref 11.5–14.5)
WBC: 7.2 x10 3/mm (ref 3.6–11.0)

## 2012-11-07 LAB — COMPREHENSIVE METABOLIC PANEL
Albumin: 3 g/dL — ABNORMAL LOW (ref 3.4–5.0)
Alkaline Phosphatase: 69 U/L (ref 50–136)
BUN: 17 mg/dL (ref 7–18)
Calcium, Total: 9.1 mg/dL (ref 8.5–10.1)
Chloride: 92 mmol/L — ABNORMAL LOW (ref 98–107)
Co2: 39 mmol/L — ABNORMAL HIGH (ref 21–32)
EGFR (African American): >60
Glucose: 95 mg/dL (ref 65–99)
Osmolality: 273 (ref 275–301)
SGPT (ALT): 20 U/L (ref 12–78)
Sodium: 136 mmol/L (ref 136–145)

## 2012-11-10 ENCOUNTER — Ambulatory Visit: Payer: Medicare Other | Admitting: Cardiovascular Disease

## 2012-12-06 ENCOUNTER — Ambulatory Visit: Payer: Self-pay | Admitting: Oncology

## 2013-01-14 ENCOUNTER — Ambulatory Visit: Payer: Self-pay | Admitting: Oncology

## 2013-01-16 ENCOUNTER — Ambulatory Visit: Payer: Self-pay | Admitting: Oncology

## 2013-02-03 ENCOUNTER — Ambulatory Visit: Payer: Self-pay | Admitting: Oncology

## 2013-03-05 DEATH — deceased

## 2013-09-30 IMAGING — CR DG CHEST 1V
1 series · 1 of 1 positions shown · non-contrast
Comparison: none

REASON FOR EXAM: ca lung shortness of breath
COMMENTS:

PROCEDURE:     DXR - DXR CHEST 1 VIEWAP OR PA  - September 03, 2012 [DATE]
RESULT:     Comparison is made to a prior study dated 06/17/2012.

[ap]
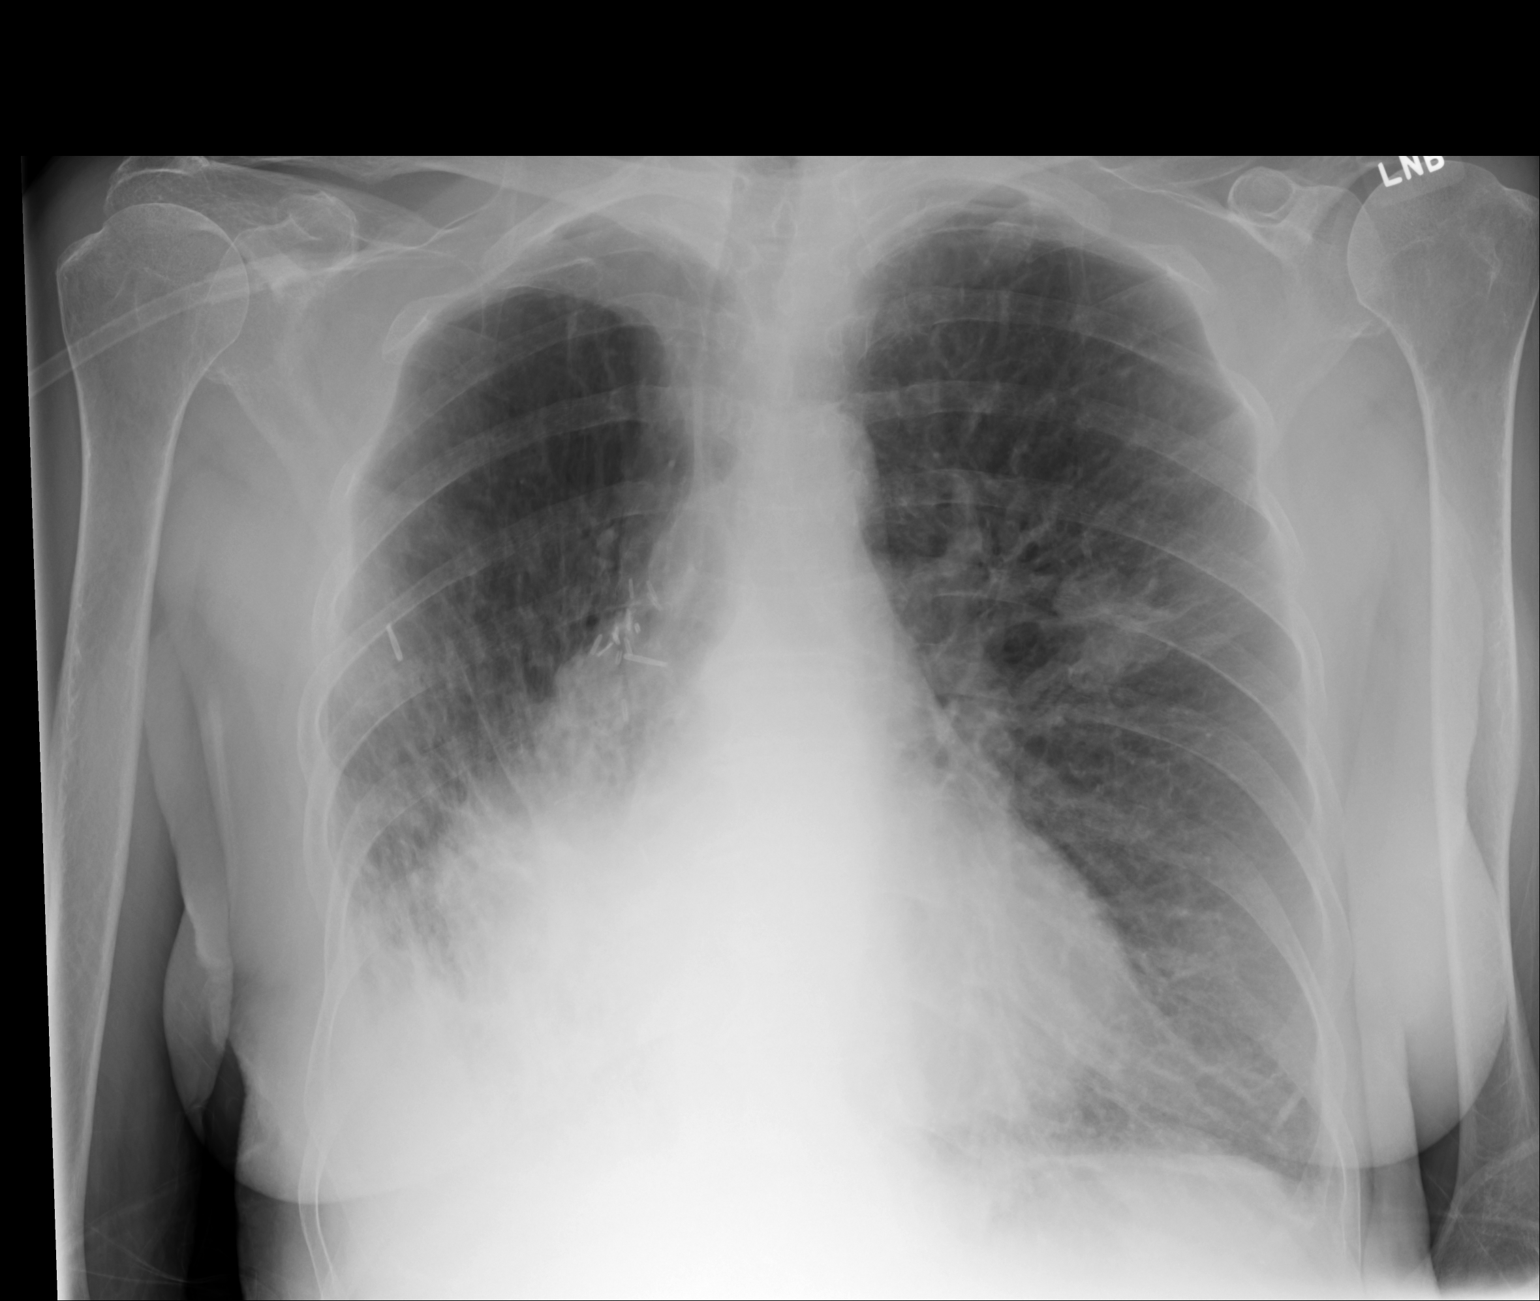

[1 of 1 positions shown; findings below may reference images not displayed]

FINDINGS: Volume loss is identified within the right hemithorax as well as
consolidative density within the right lung base. There is diffuse
thickening of the interstitial markings bilaterally. Lobulated area of
mass-like density projects in the left mid hemithorax. The cardiac
silhouette is enlarged. The visualized bony skeleton is unremarkable.
IMPRESSION: 1.  Consolidative density right lung base. Differential considerations are
consolidative infiltrate, atelectasis, mass or possibly a pleural effusion.
2.  Nodule versus focal infiltrate left hemithorax. Surveillance evaluation
recommended.

## 2013-10-17 IMAGING — CR DG CHEST 1V PORT
1 series · 1 of 1 positions shown · non-contrast
Comparison: none

REASON FOR EXAM: sob
COMMENTS:   LMP: Post-Menopausal

[ap]
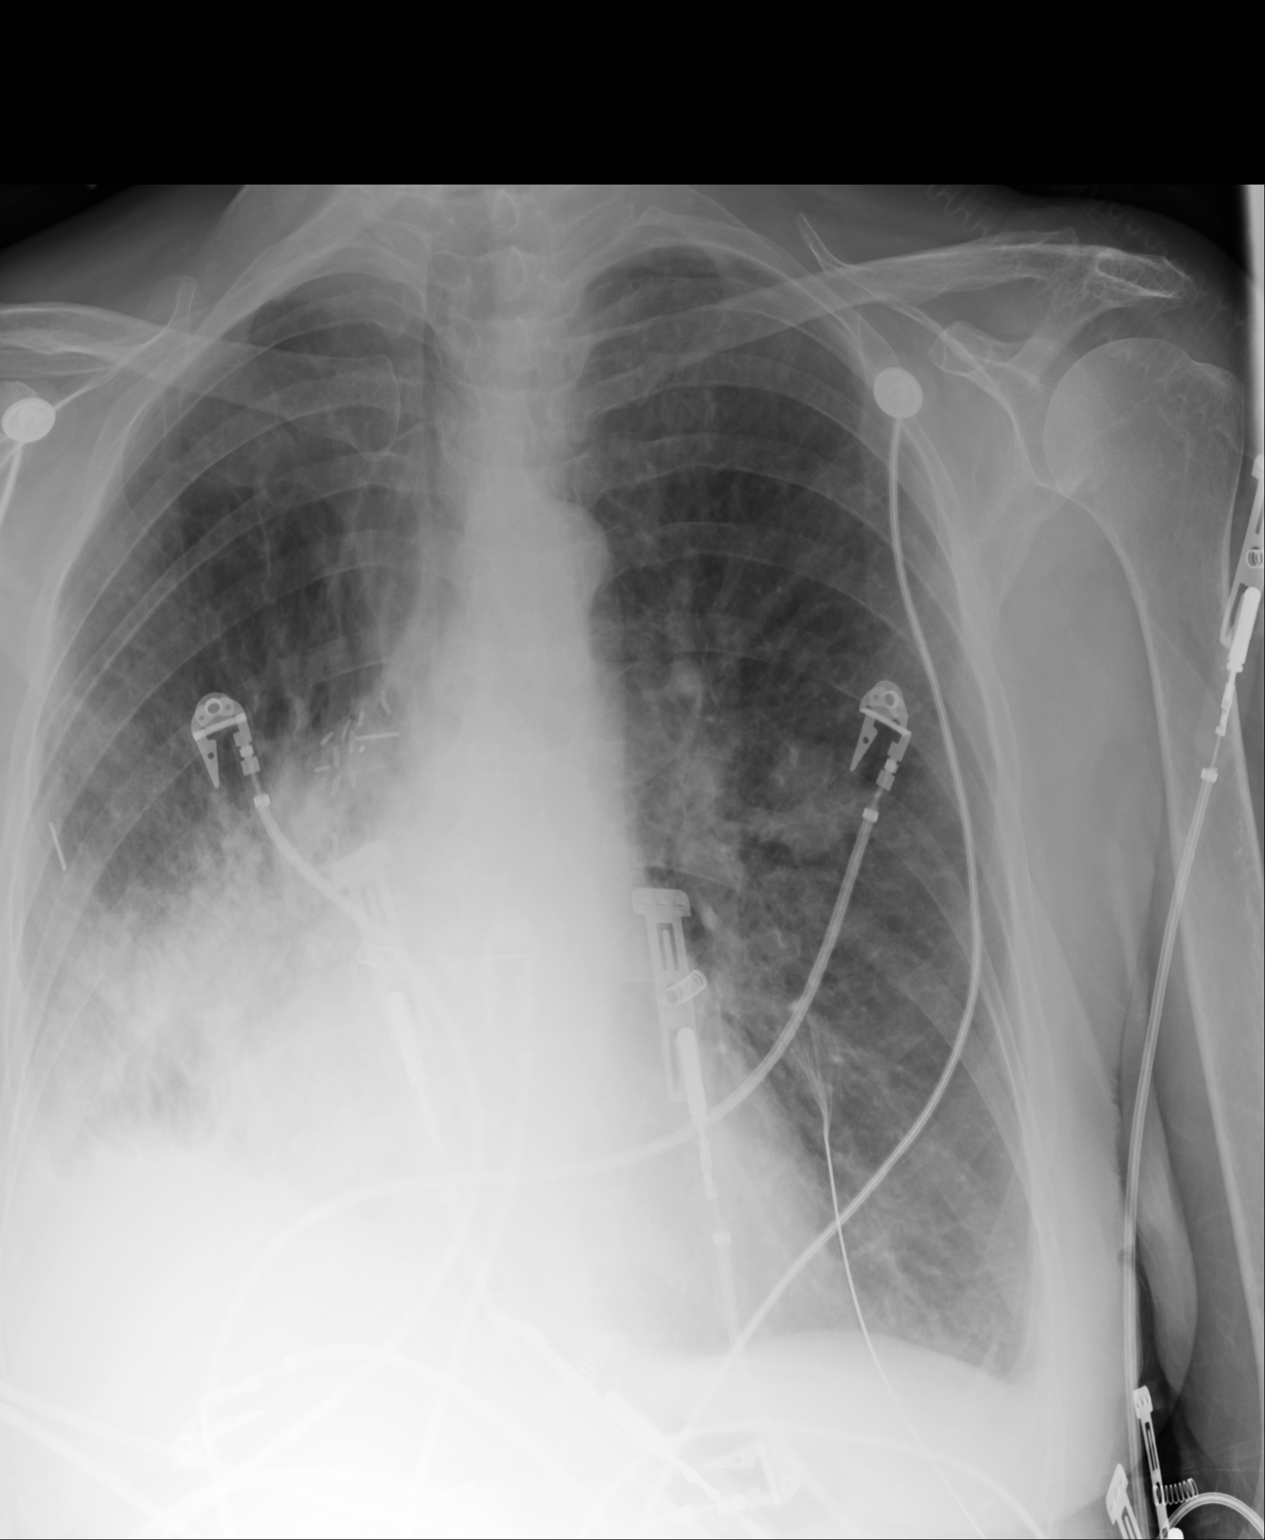

[1 of 1 positions shown; findings below may reference images not displayed]

PROCEDURE:     DXR - DXR PORTABLE CHEST SINGLE VIEW  - September 20, 2012 [DATE]

RESULT:     Comparison made to prior study of 09/20/2012. Persistent
infiltrate and/or tumor in right lung base. Surgical clips right hilum.
Persistent mass left midlung field. Cardiomegaly with mild pulmonary
vascular prominence.
IMPRESSION: Unchanged chest x-ray from chest x-ray of same date.
Findings as above.

## 2015-02-22 NOTE — Consult Note (Signed)
General Aspect 74 yo white female  with emphysema, still smoking, , mediastinal adenopathy h/o Lung cancer, receiving XRT, chemo three weeks ago, presenting with increasing SOB, tachycardia. Cardiology was consulted for arrhythmia/tachycardia.  Family reports tachycardia over the past day or so. Heart rate yesterday was up to 180 bpm. She did not want to come to the ER yesterday. Increasing SOB yesterday and heavy cough. Some fever to 99 deg. Some vomiting. New rash on her face and upper chest.  Family reports possible tachycardia several years ago, no tachycardia recently.    Present Illness . social: Lives with famil, retired, continues to smoke daily  Family hx: No significant CAD  allergies: iodine   Physical Exam:   GEN well developed, well nourished, no acute distress    HEENT red conjunctivae    NECK supple  No masses    RESP postive use of accessory muscles  wheezing  rhonchi    CARD Irregular rate and rhythm  Tachycardic  No murmur    ABD denies tenderness  soft    LYMPH negative axillae    EXTR negative edema    SKIN normal to palpation    NEURO motor/sensory function intact    PSYCH alert, A+O to time, place, person, good insight   Review of Systems:   Subjective/Chief Complaint SOB, cough, palpitations    General: Fatigue  Weakness    Skin: No Complaints    ENT: No Complaints    Eyes: No Complaints    Neck: No Complaints    Respiratory: Frequent cough  Short of breath    Cardiovascular: No Complaints  Palpitations    Gastrointestinal: No Complaints    Genitourinary: No Complaints    Vascular: No Complaints    Musculoskeletal: No Complaints    Neurologic: No Complaints    Hematologic: No Complaints    Endocrine: No Complaints    Psychiatric: No Complaints    Review of Systems: All other systems were reviewed and found to be negative    Medications/Allergies Reviewed Medications/Allergies reviewed     shingles 052209:     sepsis 010109:    Lung Cancer:    angioplasty left leg 161096:    angioplasty both legs 091008:    rt. lobectomy 045409:    Stents: in both legs for improve circulation       Admit Diagnosis:   HEART RACING: 20-Sep-2012, Active, HEART RACING      Chronic Problem:   Acute respiratory failure: (518.81) Active, ICD9, Acute respiratory failure  Home Medications: Medication Instructions Status  Tessalon Perles 100 mg oral capsule 1 cap(s) orally 3 times a day, As Needed for cough.  Active  predniSONE 20 mg oral tablet 1 tab(s) orally once a day Active  Duragesic-12 12 mcg/hr transdermal film, extended release 1 PATCH transdermal every 72 hours Active  ALPRAZolam 0.25 mg oral tablet 1 tab(s) orally every 6 hours, As Needed Active  chlorpheniramine-HYDROcodone 8 mg-10 mg/5 mL oral suspension, extended release 5 mL orally every 12 hours, As Needed Active  albuterol-ipratropium 2.5 mg-0.5 mg/3 mL inhalation solution 3 milliliter(s) inhaled 4 times a day, As Needed- for Shortness of Breath  Active  gabapentin 300 mg oral capsule 2 cap(s) orally 3 times a day Active  Zantac 75 oral tablet 1 tab(s) orally 2 times a day, As Needed- for Indigestion, Heartburn  Active  Tussin CF oral syrup 10 milliliter(s) orally every 4 hours, As Needed Active   Lab Results:  Thyroid:  16-Nov-13 09:02  Thyroid Stimulating Hormone 1.61 (0.45-4.50 (International Unit)  ----------------------- Pregnant patients have  different reference  ranges for TSH:  - - - - - - - - - -  Pregnant, first trimetser:  0.36 - 2.50 uIU/mL)  Hepatic:  16-Nov-13 09:02    Bilirubin, Total  1.6   Alkaline Phosphatase 81   SGPT (ALT) 22   SGOT (AST) 23   Total Protein, Serum  5.9   Albumin, Serum  3.1  Routine Chem:  16-Nov-13 09:02    Glucose, Serum  133   BUN 11   Creatinine (comp) 0.63   Sodium, Serum  123   Potassium, Serum 4.1   Chloride, Serum  82   CO2, Serum  37   Calcium (Total), Serum 8.5    Osmolality (calc) 249   eGFR (African American) >60   eGFR (Non-African American) >60 (eGFR values <63m/min/1.73 m2 may be an indication of chronic kidney disease (CKD). Calculated eGFR is useful in patients with stable renal function. The eGFR calculation will not be reliable in acutely ill patients when serum creatinine is changing rapidly. It is not useful in  patients on dialysis. The eGFR calculation may not be applicable to patients at the low and high extremes of body sizes, pregnant women, and vegetarians.)   Anion Gap  4  Cardiac:  16-Nov-13 09:02    CK, Total 26   CPK-MB, Serum 2.7 (Result(s) reported on 20 Sep 2012 at 09:42AM.)   Troponin I 0.02 (0.00-0.05 0.05 ng/mL or less: NEGATIVE  Repeat testing in 3-6 hrs  if clinically indicated. >0.05 ng/mL: POTENTIAL  MYOCARDIAL INJURY. Repeat  testing in 3-6 hrs if  clinically indicated. NOTE: An increase or decrease  of 30% or more on serial  testing suggests a  clinically important change)  Routine Coag:  16-Nov-13 09:02    Activated PTT (APTT) 26.4 (A HCT value >55% may artifactually increase the APTT. In one study, the increase was an average of 19%. Reference: "Effect on Routine and Special Coagulation Testing Values of Citrate Anticoagulant Adjustment in Patients with High HCT Values." American Journal of Clinical Pathology 2006;126:400-405.)   Prothrombin 13.6   INR 1.0 (INR reference interval applies to patients on anticoagulant therapy. A single INR therapeutic range for coumarins is not optimal for all indications; however, the suggested range for most indications is 2.0 - 3.0. Exceptions to the INR Reference Range may include: Prosthetic heart valves, acute myocardial infarction, prevention of myocardial infarction, and combinations of aspirin and anticoagulant. The need for a higher or lower target INR must be assessed individually. Reference: The Pharmacology and Management of the Vitamin K   antagonists: the seventh ACCP Conference on Antithrombotic and Thrombolytic Therapy. CSHFWY.6378Sept:126 (3suppl): [id number] A HCT value >55% may artifactually increase the PT.  In one study,  the increase was an average of 25%. Reference:  "Effect on Routine and Special Coagulation Testing Values of Citrate Anticoagulant Adjustment in Patients with High HCT Values." American Journal of Clinical Pathology 2006;126:400-405.)  Routine Hem:  16-Nov-13 09:02    WBC (CBC) 8.7   RBC (CBC) 4.40   Hemoglobin (CBC) 12.2   Hematocrit (CBC) 36.4   Platelet Count (CBC) 168 (Result(s) reported on 20 Sep 2012 at 09:31AM.)   MCV 83   MCH 27.8   MCHC 33.7   RDW  19.7   EKG:   Interpretation EKG shows atrial flutter/fib with rate 137, no significant ST or T wave changes    Percocet: N/V  IVP Dye: Itching  Hydrocodone Bitartrate: Itching  Morphine: Other  Levaquin: N/V/Diarrhea, Dizzy/Fainting    Impression 74 yo white female  with emphysema, still smoking, , mediastinal adenopathy h/o Lung cancer, receiving XRT, chemo three weeks ago, presenting with increasing SOB, tachycardia. Cardiology was consulted for arrhythmia/tachycardia.  1) Atrial fibrillation: Etiology likely mutifactorial: underlying hypoxia/lung disease, recent N/V and coughing,  Mild rate imporvement on diltiazem ggt at 15 mg /hour Will start amiodarone 150 mg bolus, then infusion 1 mg/hr Would continue diltiazem infusion as B P permits If rate continues to be elevated, could add digoxin iv Would hold on heparin ggt givedn underlying lung CA  2) Lung Ca, b/l receiving XRT, last chemo several weeks ago  3)SOB: likely related to tachycardia, unable to rule out pulmonary edema (rate related) If SOB gvets worse, would give lasix, mhold IVF   Electronic Signatures: Ida Rogue (MD)  (Signed [phone] 12:43)  Authored: General Aspect/Present Illness, History and Physical Exam, Review of System, Past Medical History,  Health Issues, Home Medications, Labs, EKG , Allergies, Impression/Plan   Last Updated: 16-Nov-13 12:43 by Ida Rogue (MD)

## 2015-02-22 NOTE — Consult Note (Signed)
PATIENT NAME:  Cheryl Duncan, Cheryl Duncan MR#:  [id number] DATE OF BIRTH:  Apr 15, 1941  DATE OF CONSULTATION:  09/21/2012  REFERRING PHYSICIAN:  Dr. Vianne Bulls CONSULTING PHYSICIAN:  Alexx Giambra R. Ma Hillock, MD  REASON FOR CONSULTATION: Lung cancer, admitted with atrial fibrillation with RVR and respiratory failure.   HISTORY OF PRESENT ILLNESS: The patient is a 74 year old female with known history of recurrent non-small cell lung cancer, stage IIIB, follows with Dr. Oliva Bustard. The patient received one cycle of chemotherapy with Taxol and carboplatin. She currently has been continued on single agent radiation treatment. The patient has had issues with persistent generalized weakness, chronic obstructive pulmonary disease with ongoing tobacco abuse. Past medical history otherwise significant for postherpetic neuralgia. The patient has been admitted to the hospital after she presented with intermittent tachycardia with heart rates going up to 170 to 180 at home making her feel weak and sick. She was found to be in rapid atrial fibrillation and currently heart rate is controlled in the Critical Care Unit with amiodarone, digoxin, and metoprolol. Chest x-ray also showed right lower lobe infiltrate consistent with pneumonia. The patient is also on treatment with broad-spectrum antibiotic coverage along with bronchodilators and steroids for chronic obstructive pulmonary disease exacerbation. Clinically, states that she is beginning to feel better, ate better today.   PAST MEDICAL HISTORY: As in history of present illness above.   SOCIAL HISTORY: Chronic smoker 1 pack per day. Denies alcohol usage.   PAST SURGICAL HISTORY:    1. Angioplasty left leg. 2. Right lobectomy 2005 for lung cancer.  3. Stents in both legs for peripheral vascular disease.   FAMILY HISTORY: Noncontributory.   ALLERGIES: Hydrocodone, IV dye, Levaquin, morphine and Percocet.   HOME MEDICATIONS:  1. Xanax 0.25 mg q.6 hours p.r.n.  2. Duragesic patch  12 mcg every 72 hours. 3. Neurontin 600 mg p.o. t.i.d.  4. Prednisone 20 mg daily.  5. Tessalon Perles 100 mg t.i.d.  6. Zantac 75 mg b.i.d.  7. Albuterol with ipratropium nebulizer q.i.d.   REVIEW OF SYSTEMS: CONSTITUTIONAL: As in history of present illness. Also had a low-grade fever at home. No night sweats. HEENT: Has dizziness on getting up and ambulating. No headaches, epistaxis, ear or jaw pain. CARDIAC: No angina, orthopnea or PND. Had some palpitations at home. LUNGS: As in history of present illness. No hemoptysis or chest pain. GASTROINTESTINAL: No nausea, vomiting, or diarrhea. No bright red blood in stools or melena. GENITOURINARY: No dysuria or hematuria. SKIN: No new rashes or pruritus. HEMATOLOGIC: Denies obvious bleeding symptoms. EXTREMITIES: No new swelling or pain. MUSCULOSKELETAL: Denies any new bone pains. NEUROLOGIC: No new focal weakness, seizures, or loss of consciousness. ENDOCRINE: No polyuria or polydipsia. Appetite better today.   PHYSICAL EXAMINATION:  GENERAL: The patient is sitting up in bed, alert and oriented and converses appropriately. No acute distress. No icterus. Mild pallor.   VITAL SIGNS: Temperature 97.6, pulse 75, respiratory rate 22, blood pressure 105/69, 94% on 2.5 liters.   HEENT: Normocephalic, atraumatic. Extraocular movements intact. Sclera anicteric. No oral thrush.   NECK: Negative for lymphadenopathy.   CARDIOVASCULAR: S1 and S2, irregular.   LUNGS: Lungs show bilateral diminished breath sounds overall. No rhonchi or crepitation.   ABDOMEN: Soft, nontender. No hepatomegaly.   EXTREMITIES: Trace edema. No cyanosis.   NEUROLOGIC: Limited examination. Cranial nerves are intact. Moves all extremities spontaneously.   MUSCULOSKELETAL: No obvious joint deformity or swelling.   SKIN: No new rashes or major bruising.   LABORATORY, DIAGNOSTIC, AND RADIOLOGICAL DATA: Sputum  culture pending. WBC 2,900, ANC 2,700, hemoglobin 9.8, platelets 125,  creatinine 0.51, potassium 3.8, calcium 8.2. Blood cultures negative so far.   IMPRESSION AND RECOMMENDATIONS: 74 year old female patient with history of chronic obstructive pulmonary disease, chronic smoking, recurrent non-small cell lung cancer stage IIIB and last received chemotherapy on 10/23, currently on radiation therapy, who is admitted to the hospital with atrial fibrillation with rapid ventricular rate, chronic obstructive pulmonary disease exacerbation and pneumonia. The patient clinically is beginning to feel better. CBC does not show neutropenia, she has chronic anemia. Agree with ongoing treatment including treatment for atrial fibrillation, broad-spectrum antibiotic coverage, bronchodilators and IV steroids. No major pain issues at this time. Continue to monitor CBC. Will request her primary oncologist, Dr. Oliva Bustard, to continue follow-up starting tomorrow onwards. The patient explained above, agreeable to this plan.   Thank you for the referral. Please feel free to contact me if any additional questions.  ____________________________ Rhett Bannister Ma Hillock, MD srp:ap D: 09/21/2012 22:18:58 ET T: 09/22/2012 07:15:16 ET JOB#: [id number]  cc: Trevor Iha R. Ma Hillock, MD, <Dictator> Alveta Heimlich MD ELECTRONICALLY SIGNED 09/22/2012 11:52

## 2015-02-22 NOTE — Discharge Summary (Signed)
PATIENT NAME:  Cheryl Duncan, Cheryl Duncan MR#:  [id number] DATE OF BIRTH:  08-15-1941  DATE OF ADMISSION:  09/20/2012 DATE OF DISCHARGE:  09/24/2012  DISCHARGE DIAGNOSES:  1. Acute on chronic respiratory failure secondary to chronic obstructive pulmonary disease exacerbation and pneumonia.  2. Mild acute systolic heart failure.   3. Atrial fibrillation with RVR.  4. Lung cancer, on palliative radiation therapy.  5. Anorexia.  6. Anxiety.   DISCHARGE MEDICATIONS:  1. Albuterol-ipratropium nebulizers every six hours as needed. 2. Gabapentin 300 mg 2 capsules p.o. t.i.d.  3. Alprazolam 0.25 mg p.o. q.6 hours.  4. Duragesic patch 12 mcg every 72 hours.  5. Zantac 75 mg p.o. b.i.d.  6. Tessalon Perles 100 mg p.o. t.i.d.  7. Tussin oral syrup 10 mL every four hours as needed for cough. 8. Prednisone taper 60 mg for three days, 40 mg for two days, then 20 mg for three days, and then continue 20 mg daily.  9. Amiodarone 400 mg p.o. b.i.d.  10. Metoprolol 25 mg p.o. b.i.d.  11. Megace 5 mL daily.  12. Bactrim 1 tablet p.o. b.i.d. for 10 days.   OXYGEN: 2 liters by nasal cannula.   CONSULTATIONS:  1. Cardiology consult with Dr. Ida Rogue and Dr. Fletcher Anon   2. Consultation with Dr. Oliva Bustard     PERTINENT LAB DATA: WBC on admission 8.7, hemoglobin 12.2, hematocrit 36.4, platelets 168. Electrolytes on admission sodium 123, potassium 4.1, chloride 82, bicarb 37, BUN 11, creatinine 0.63, glucose 133.   EKG showed atrial fibrillation with RVR, 151 beats per minute.   Chest x-ray showed infiltrate or tumor in the right lung base, surgical clips in the right hilum.   Blood cultures have been negative. Sodium improved to 131 on the 17th.   EF 45 to 50%. This was done on November 17th.   The patient had hypokalemia with potassium of 3 yesterday which is being replaced. Repeat potassium came back at 4.2. The patient is discharged with Lasix 20 mg daily and potassium supplements as well. She is being followed  by Dr. Oliva Bustard regarding further continuation of radiation therapy.   HOSPITAL COURSE: The patient is a 74 year old female patient who follows up with Dr. Oliva Bustard, has history of lung cancer on palliative radiation therapy and history of COPD with ongoing tobacco abuse who came in because of shortness of breath and tachycardia. Look at the history and physical for full details. The patient's heart rate was to more than 160 at home and she had low-grade fever at home. Initially she received adenosine in the ER and started on Cardizem drip in the Emergency Room for atrial fibrillation with RVR but the patient developed hypotension. Dr. Rockey Situ immediately saw the patient in the Emergency Room. The patient was started on amiodarone drip and she was admitted to ICU for atrial fibrillation with RVR. She also had pneumonia when she came in so we started her on antibiotics with IV vanc and Zosyn. The patient received amiodarone bolus 150 mg and started on full drip. The patient was also given small dose of beta-blockers. She was able to come off the Cardizem drip around 11 p.m. on the night of the 16th we changed her to Cardizem p.o. 200 mg p.o. b.i.d. and discontinued the Cardizem drip and transferred her to telemetry. The patient continued on amiodarone 200 mg p.o. b.i.d. along with metoprolol 12.5 mg daily. Echocardiogram was done which showed EF of around 45 to 50% so she was started on Lasix 20 mg p.o.  b.i.d. The patient's rate was reasonably controlled from the 17th to the 18th. Again she went into rapid atrial fibrillation with RVR up to 160 beats per minute so was transferred to ICU and again started on amiodarone drip. She actually was on amiodarone drip on November 19th and today morning heart rate again is stabilized at 70's so Dr. Rockey Situ recommended to increase the amiodarone to 400 mg p.o. b.i.d. and increase the metoprolol to 25 mg p.o. b.i.d. We did that and amiodarone drip stopped and she has been  maintaining 70's in sinus so she has been off the drip for three hours and she is maintaining heart rates at 70's, blood pressure 117/63. The patient will be discharged home with amiodarone and metoprolol and she will be seeing Dr. Rockey Situ in the office in 1 to 2 weeks. She is not a candidate for anticoagulation due to cancer and high risk for bleeding.   For acute on chronic respiratory failure due to COPD exacerbation and pneumonia, the patient was treated for Strep pneumonia in October by Dr. Oliva Bustard and was given [facility]. She finished the course but when she came she had a lot of cough and wheezing and low-grade fever at home. Her WBC was 8. Chest x-ray showed right lower lobe infiltrate consistent with pneumonia and also had nodular density in the left lung.  The patient has been treated with Vanc and Zosyn. Sputum cultures were obtained. Blood cultures have been negative. Sputum cultures showed light growth of Stenotrophomonas maltophilia sensitive to Bactrim and Levaquin. The patient is allergic to Levaquin so we discharged her with Bactrim for 10 days. The patient has been afebrile and her cough has improved a lot and wheezing also improved. She can take tapering dose of prednisone along with nebulizers and Bactrim and follow-up with her primary doctor, Dr. Oliva Bustard, as an outpatient. She will be needing repeat BMP because she is on Bactrim. needs rpt sputum cultures  For COPD and ongoing tobacco abuse, she will continue nebulizers and prednisone. She takes 20 mg daily. Continue that and continue oxygen.   For anorexia, husband complained that she is not eating much. The patient was started on small dose Megace and after starting the medication her appetite improved and she is eating well. We gave a prescription for Megace.   TIME SPENT ON DISCHARGE PREPARATION: More than 30 minutes.  CODE STATUS: DO NOT RESUSCITATE.   ____________________________ Epifanio Lesches, MD sk:drc D: 09/24/2012  13:33:51 ET T: 09/24/2012 17:41:51 ET JOB#: [id number]  cc: Epifanio Lesches, MD, <Dictator> Epifanio Lesches MD ELECTRONICALLY SIGNED 09/30/2012 12:26

## 2015-02-22 NOTE — H&P (Signed)
PATIENT NAME:  Cheryl Duncan, Cheryl Duncan MR#:  [id number] DATE OF BIRTH:  1940/12/02  DATE OF ADMISSION:  09/20/2012  PRIMARY CARE PHYSICIAN:  Dr. Oliva Bustard  ER PHYSICIAN: Dr. Benjaman Lobe    CHIEF COMPLAINT: Shortness of breath and tachycardia.   HISTORY OF PRESENT ILLNESS: The patient is 74 year old female patient with history of lung cancer on palliative radiation therapy, brought in by the family because the patient was having shortness of breath. The patient was noticed to have heart rate up to 180 yesterday. The patient was seen by the ER physician and initially thought to be in supraventricular tachycardia recevged adenosine.> and after that EKG showed atrial fibrillation with rapid ventricular response.  The patient received Cardizem IV  total of  35 mg IV push and 18 mg of adenosine.  Because of persistent tachycardia with atrial fibrillation with rapid ventricular response, heart rate up to 140, she was started on Cardizem drip. However, the blood pressure dropped and Dr. Rockey Situ immediately saw the patient in the ER and he suggested amiodarone drip. She is going to be started on amiodarone drip and is going to be admitted to the Intensive Care Unit for atrial fibrillation with rapid ventricular response and hypotension. The patient was seen at bedside. According to the husband, she has been having cough but it has gotten worse and had low-grade fever yesterday. She was also noticed to have poor p.o. intake and generalized weakness. The patient usually ambulates without a walker but has been unstable since yesterday. The patient is getting radiation Monday through Friday.   Her last radiation was Thursday.  The  patient did not get radiation on Friday because the [facility] was closed. The patient had noticed her heart was beating fast when she was reading a book yesterday afternoon.  At that time her heart rate was around 160 to 180 according to the daughter. The patient has shortness of breath and has oxygen at  home and she is right now on BiPAP 12/6, 100 percent FiO2, saturating 100 percent. The patient does have a cough. She was treated for pneumonia, streptococcal pneumonia, on 10/30 and was discharged on 11/01.  At that time, she was discharged on antibiotic cephalexin. She had Streptococci in the sputum.   PAST MEDICAL HISTORY:  1. History of lung cancer in both lungs and lymph nodes on palliative radiation.  She tried chemotherapy only one time and then stopped because of her intolerance.   2. History of  chronic obstructive pulmonary disease with ongoing tobacco abuse. 3. Postherpetic neuralgia.  ALLERGIES: Multiple allergies to hydrocodone, IV dye, Levaquin, morphine, and Percocet.   SOCIAL HISTORY: Still smokes a pack per day at least. No alcohol. No drugs.   PAST SURGICAL HISTORY: She has a history of multiple surgeries. 1. Surgery for lung cancer.  2. Angioplasty of the left leg. 3. Right lobectomy in 2005. 4. Stents in both legs for improving circulation.   HOME MEDICATIONS: 1. Albuterol with ipratropium nebulizers 4 times a day. 2. Xanax 0.25 mg p.o. q. 6 hours as needed. 3. Duragesic patch 12 mcg q.72 h.  4. Neurontin 300 mg p.o. t.i.d. That is 2 capsules. She takes totally 600 mg p.o. t.i.d.  5. Prednisone 20 mg p.o. daily.  6. Tessalon Perles 100 mg p.o. t.i.d.  7. Zantac 75 mg p.o. b.i.d.   REVIEW OF SYSTEMS: CONSTITUTIONAL: Had low-grade fever yesterday and fatigue.  EYES: No blurred vision. The patient does have some petechial rash all over the face. She has been  coughing a lot in the last few days. ENT: Has no tinnitus. No epistaxis. No difficulty swallowing. RESPIRATORY: Has cough, shortness of breath, and chronic obstructive pulmonary disease. CARDIOVASCULAR:  No chest pain. Complains of palpitations. Also has a small amount of pedal edema noticed by the family. GI: No nausea. No vomiting. No abdominal pain, has poor appetite. GU: No dysuria. ENDOCRINE: No polyuria or  nocturia. INTEGUMENT: The patient has some rash observed like a small erythematous rash on the face and also on the anterior chest. MUSCULOSKELETAL: No joint pain. NEUROLOGIC: No numbness. No weakness. PSYCH: No anxiety or insomnia.   PHYSICAL EXAMINATION:  VITAL SIGNS: Temperature 97.9, pulse 170 when she came in but right now she is around 140, in atrial fibrillation with RVR. Blood pressure is 90/70 now.  Initially it was 147/99. She is saturating 100% on BiPAP.  She is on BiPAP 12/6 and rate of  14.   GENERAL: She is alert, awake, oriented, cachectic female and having a lot of coughing even on BiPAP mask.   HEENT: Head atraumatic, normocephalic. Pupils equally reacting to light. Extraocular movements are intact. ENT: No tympanic membrane congestion. No turbinate hypertrophy. No oropharyngeal erythema.   NECK: Normal range of motion. No JVD. No carotid bruit.   CARDIOVASCULAR: S1, S2 tachycardic, irregularly irregular.   LUNGS: Bilateral basilar crepitation and expiratory wheeze in all lung fields.   ABDOMEN: Soft. Bowel sounds nontender. No organomegaly. No hernias.   EXTREMITIES: Trace edema present.   NEUROLOGIC: Alert, awake, oriented. Cranial nerves II through XII are intact. Power 5/5 in upper and lower extremities. Sensation intact.  DTRs 2+ bilaterally.   PSYCH: Mood and affect are within normal limits.   LABORATORY AND RADIOLOGICAL DATA: X-ray of the chest showed unchanged x-ray of the same date. First x-ray this morning showed right lower lobe infiltrate with pneumonia, underlying malignancy not excluded. The patient has a persistent nodular density in the right lung. CK total 26, CPK-MB 2.7.   Electrolytes:  Sodium 123,  potassium 4.1, chloride 82, bicarbonate 37, BUN 11, creatinine 0.63, glucose 133. LFTs are within normal limits. WBC 8.7, hemoglobin 12.2, hematocrit 36.4, platelets 168, INR 1.  Troponin 0.02. Basic metabolic panel sodium 440 on 11/06.   ASSESSMENT AND PLAN:   1. The patient is 74 year old female patient with acute on chronic respiratory failure secondary to pneumonia and lung cancer. She has already received Rocephin and Zithromax. Get sputum cultures. She has a history of strep pneumonia before so we will repeat sputum cultures. Continue broader coverage because of her immunosuppression. We will add vancomycin and Zosyn and follow clinical response.  2. The patient has atrial fibrillation with rapid ventricular response.  The patient's cardiac status is decompensated likely secondary to underlying lung disease with pneumonia with recent radiation therapy. The patient was seen by Dr. Rockey Situ.  She is going to be admitted to the Intensive Care Unit. Continue amiodarone drip and IV fluids for hypotension.  The patient will be weaned off the Cardizem because of hypotension issues. In case heart rate is still up, can use low-dose digoxin at 0.25 IV push.  3. Hyponatremia. She has evidence of poor intake and dehydration clinically. She can get IV fluids normal saline at 80 mL.  If she wheezes we can use some Lasix but continue to hydrate at this time. Her respiratory status is worse likely secondary to pneumonia and lung cancer.  4. Chronic obstructive pulmonary disease with ongoing tobacco abuse. Continue nebulizers and steroids and oxygen with  BiPAP and wean off BiPAP as tolerated.  5. Lung cancer with palliative radiation therapy. Dr. Oliva Bustard is her doctor.   The patient's condition was discussed with the family.   CODE STATUS is DO NOT RESUSCITATE/ DO NOT INTUBATE. Condition is very critical.    ____________________________ Epifanio Lesches, MD sk:bjt D: 09/20/2012 12:38:27 ET T: 09/20/2012 13:25:49 ET JOB#: [id number]  cc: Epifanio Lesches, MD, <Dictator> Janak K. Oliva Bustard, MD Epifanio Lesches MD ELECTRONICALLY SIGNED 10/21/2012 15:01

## 2015-02-22 NOTE — H&P (Signed)
PATIENT NAME:  Cheryl Duncan, Cheryl Duncan MR#:  [id number] DATE OF BIRTH:  1941-05-20  DATE OF ADMISSION:  11/04/2012  PRIMARY CARE PHYSICIAN: Dr. Oliva Bustard.   CHIEF COMPLAINT: Chest pain and pressure and shortness of breath.   HISTORY OF PRESENT ILLNESS: This is a 74 year old female who presents to the Emergency Room due to worsening chest pressure and shortness of breath that began two days ago. Normally she has some shortness of breath given her history of lung cancer and COPD, but she normally does not have any pain or pressure. She also felt like her shortness of breath had gotten a little bit worse and therefore came to the ER for further evaluation. In the Emergency Room, the patient had a chest x-ray finding suggestive of a persistent right lower lobe infiltrate consistent with pneumonia. She was also noted to be short of breath and in COPD exacerbation. Hospitalist services were contacted for further treatment and evaluation.   REVIEW OF SYSTEMS:  CONSTITUTIONAL: No documented fever. No weight gain, no weight loss.  EYES: No blurry or double vision.  ENT: No tinnitus. No postnasal drip. No redness of the oropharynx.  RESPIRATORY: Positive cough, positive wheeze. No hemoptysis. Positive dyspnea.  CARDIOVASCULAR: Positive chest pain. No orthopnea, no palpitations, no syncope.  GASTROINTESTINAL: No nausea, no vomiting, no diarrhea, no abdominal pain, no melena, no hematochezia.  GENITOURINARY: No dysuria, no hematuria.  ENDOCRINE: No polyuria or nocturia. No heat or cold intolerance.  HEMATOLOGIC:  No anemia or bruising or bleeding.  INTEGUMENTARY: No rashes. No lesions.  MUSCULOSKELETAL: No arthritis, no swelling, no gout.  NEUROLOGIC: No numbness, weakness, tingling or ataxia. No seizure-type activity.  PSYCHIATRIC: Positive anxiety. No insomnia, no ADD.   PAST MEDICAL HISTORY:  Consistent with lung cancer. 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 COPD with  ongoing tobacco abuse, anxiety, chronic pain due to malignancy, neuropathy, GERD, active shingles.   ALLERGIES: HYDROCODONE, IV DYE, LEVAQUIN, MORPHINE AND PERCOCET.   SOCIAL HISTORY: Still smokes about 1/2 pack per day, has been smoking for the past 50+ years. No alcohol abuse. No illicit drug abuse. She lives at home with her husband.   FAMILY HISTORY: Mother died from cancer of unknown type. Father died from complications of asthma.   CURRENT MEDICATIONS: Acyclovir 400 mg t.i.d., Albuterol ipratropium nebulizer 4 times daily as needed, amiodarone 200 mg b.i.d., Duragesic 12 mcg patch every 72 hours, Lasix 20 mg daily, gabapentin 300 mg 2 caps t.i.d., Megace 40 mg/mL 10 mL daily, metoprolol 25 mg b.i.d.,  potassium 20 mEq at bedtime, Spiriva 1 puff daily, Zantac 75 mg daily as needed.   PHYSICAL EXAMINATION:  VITAL SIGNS: On admission, temperature is 97.0, pulse 62, respirations 20, blood pressure 121/59, saturations 100% on 2 liters nasal cannula.  GENERAL: She is a pleasant-appearing female in mild respiratory distress.  HEENT: Atraumatic, normocephalic. Extraocular muscles are intact. Pupils are equal and reactive to light. Sclerae are anicteric. No conjunctival injection. No pharyngeal erythema.  NECK: Supple. There is no jugular venous distention, no bruits, no lymphadenopathy or thyromegaly.  HEART: Tachycardic, regular. No murmurs, rubs, or clicks.  LUNGS: She has diffuse wheezing and rhonchi bilaterally. Negative use of accessory muscles. No dullness to percussion.  ABDOMEN: Soft, flat, nontender, nondistended. Has good bowel sounds. No hepatosplenomegaly appreciated.  EXTREMITIES: No evidence of any cyanosis, clubbing, or peripheral edema. Has +2 pedal and radial pulses bilaterally.  NEUROLOGIC: The patient is alert, awake, and oriented x3 with  no focal motor or sensory deficits appreciated bilaterally.  SKIN: Moist and warm with no rash appreciated.  LYMPHATIC: There is no cervical  or axillary lymphadenopathy.   LABORATORY AND RADIOLOGICAL DATA:  Serum glucose of 90, BUN 9, creatinine 0.7, sodium 134, potassium 3.8, chloride 91, bicarbonate 40.0. Troponin less than 0.02. White cell count 7.1, hemoglobin 11.2, hematocrit 33.6, platelet count of 176.0.  The patient did have a chest x-ray done which showed an unchanged right lower lobe infiltrate consistent with pneumonia and a small right pleural effusion. Mass lesion in the left mid lung appears to be slightly smaller compared to her previous chest x-ray.   ASSESSMENT AND PLAN: This is a 74 year old female with history of lung cancer, atrial fibrillation, COPD, neuropathy, anxiety, active shingles, presents to the hospital due to chest pain and pressure and also noted to be with shortness of breath.   1. COPD exacerbation: This is likely secondary to her lung cancer and also underlying pneumonia with ongoing tobacco abuse. The patient apparently was on p.o. prednisone but has just recently stopped it, therefore, her symptoms are worse. I will start her on some IV steroids, place her on DuoNebs around-the-clock, also continue Spiriva. I will add empiric antibiotics for pneumonia with ceftriaxone and Zithromax and follow her clinically, follow blood cultures, follow sputum culture.  2. Chest pain and pressure: I think this is actually related to her malignancy and COPD. No evidence of any acute coronary syndrome. I will place her on telemetry, follow serial cardiac markers for now. Her first set of troponins is negative.  3. History of chronic atrial fibrillation: Currently is rate controlled. Continue metoprolol and amiodarone.  4. Anxiety: Continue Xanax.  5. Neuropathy: Continue Neurontin.  6. Shingles: Continue acyclovir. Place her on air-borne precautions.  7. GERD: Continue Zantac.   CODE STATUS: The patient is a FULL CODE.   TIME SPENT: 50 minutes.   ____________________________ Belia Heman. Verdell Carmine,  MD [email or web address] D: 11/04/2012 14:51:05 ET T: 11/04/2012 15:06:13 ET JOB#: [id number]  cc: Belia Heman. Verdell Carmine, MD, <Dictator> Henreitta Leber MD ELECTRONICALLY SIGNED 11/04/2012 21:00

## 2015-02-25 NOTE — Discharge Summary (Signed)
PATIENT NAME:  Cheryl Duncan, Cheryl Duncan MR#:  [id number] DATE OF BIRTH:  [birth year]  DATE OF ADMISSION:  11/04/2012 DATE OF DISCHARGE:  11/05/2012  DISCHARGE DIAGNOSES: 1. Acute on chronic respiratory failure secondary to chronic obstructive pulmonary disease flare and pneumonia.  2. History of lung cancer.  3. Chronic atrial fibrillation.  4. Shingles.  CODE STATUS: FULL CODE.   HOSPITAL COURSE: This is a 74 year old female with history of lung cancer, on home oxygen 2 liters, with ongoing tobacco abuse who was admitted for shortness of breath and chest pain. Look at the history and physical for full details. The patient is still smoking about 1/2 pack per day. She had wheezing in both lungs and chest x-ray showed unchanged right lower lobe infiltrate with pneumonia and also left lung cancer. The patient was admitted for COPD flare because of her wheezing and symptoms and started on IV Solu-Medrol and started on Rocephin and Zithromax for possible pneumonia. The patient was seen this morning, she feels much better, and she really wants to go home. She still has a little bit of wheezing, but she has DuoNebs at home. I advised her to take every 4 hours and also in between she can take every 2 hours as needed. I advised her to continue her Spiriva. Regarding her pneumonia, she is afebrile and her white count is normal. The patient is given a prescription for amoxicillin and Zithromax. The patient's CBC and BMP are within normal limits and troponins have been negative x 3 so chest pain is likely secondary to COPD flare. The patient has shingles and has been treated with Valtrex, so she can continue that. She was based on droplet precautions here. The patient has lung cancer. She follows up with Dr. Oliva Bustard for that. She has a chronic atrial fibrillation, seen by Dr. Rockey Situ before. She is on amiodarone and metoprolol and her heart rate is stable around 70. So she can continue that. She is given a tapering dose of  prednisone along with amoxicillin and Zithromax. Her daughter is at bedside and she will be discharged to home today. She has a poor       appetite and was started on Megace the last time when she was here and now she feels much better. With Megace her appetite improved so she can continue that.   TIME SPENT ON DISCHARGE PREPARATION: More than 30 minutes.  ____________________________ Epifanio Lesches, MD sk:sb D: 11/05/2012 11:02:57 ET T: 11/05/2012 11:17:17 ET JOB#: [id number]  cc: Epifanio Lesches, MD, <Dictator> Epifanio Lesches MD ELECTRONICALLY SIGNED 12/01/2012 8:27

## 2015-02-27 NOTE — Discharge Summary (Signed)
PATIENT NAME:  Cheryl Duncan, Cheryl Duncan MR#:  [id number] DATE OF BIRTH:  12-03-1940  DATE OF ADMISSION:  10/31/2011 DATE OF DISCHARGE:  11/02/2011  FINAL DIAGNOSES:  1. Influenza A with acute bronchitis.  2. Myelodysplastic syndrome and neutropenia.  3. Carcinoma of lung, status post chemoradiation therapy.  4. Chronic smoker and chronic obstructive pulmonary disease.   DISCHARGE MEDICATIONS:  1. Tamiflu 75 mg 1 capsule every 12 hours for two days. 2. Aleve 220 mg 1 tablet as needed for pain.   HOME OXYGEN: None.   DIET: Regular.   ACTIVITY: As tolerated.   FOLLOW UP: Patient was advised to call my office if continues to have fever or any other problems.   HISTORY OF PRESENT ILLNESS: Patient was brought in my clinic with history of increasing shortness of breath, high fever up to 102, feeling extremely weak, increasing cough. Patient had a previous history of carcinoma of lung treated with resection, chemotherapy, has chronic neutropenia. Patient continues to smoke. Also had postherpetic nausea.   PAST MEDICAL HISTORY, SOCIAL HISTORY, FAMILY HISTORY AND EXAMINATION: Have been recorded on the chart.   LABORATORY, DIAGNOSTIC AND RADIOLOGICAL DATA: Available most important lab data: On admission white count 0.8, on 27th white count was 1.3. Hemoglobin 11.7, platelet count 60,000. BUN 12, sodium 134.   All the blood cultures were negative. Blood test was positive for influenza A.   Electrocardiograms revealed tachycardia.   HOSPITAL COURSE: During hospital stay patient was started on Tamiflu and IV antibiotics and inhalation treatment. Gradually patient's condition improved. Patient was kept on Neupogen for prophylactic measures. Patient was then discharged home with further follow up as needed basis. Overall prognosis is guarded because of underlying disease.   ____________________________ Martie Lee. Suhani Stillion, MD jkc:cms D: 11/27/2011 07:54:45 ET T: 11/27/2011 10:13:20  ET JOB#: [id number]  cc: Delorise Shiner K. Oliva Bustard, MD, <Dictator> Jobe Gibbon MD ELECTRONICALLY SIGNED 11/28/2011 13:10
